# Patient Record
Sex: Female | Born: 1970 | Race: White | Hispanic: No | Marital: Married | Smoking: Never smoker
Health system: Southern US, Community
[De-identification: ages and names within clinical notes are randomized; demographics above are authoritative.]

## PROBLEM LIST (undated history)

## (undated) DIAGNOSIS — R112 Nausea with vomiting, unspecified: Secondary | ICD-10-CM

## (undated) DIAGNOSIS — Z9889 Other specified postprocedural states: Secondary | ICD-10-CM

## (undated) DIAGNOSIS — K219 Gastro-esophageal reflux disease without esophagitis: Secondary | ICD-10-CM

## (undated) DIAGNOSIS — T8859XA Other complications of anesthesia, initial encounter: Secondary | ICD-10-CM

## (undated) DIAGNOSIS — I10 Essential (primary) hypertension: Secondary | ICD-10-CM

## (undated) DIAGNOSIS — E7800 Pure hypercholesterolemia, unspecified: Secondary | ICD-10-CM

## (undated) DIAGNOSIS — H8091 Unspecified otosclerosis, right ear: Secondary | ICD-10-CM

## (undated) DIAGNOSIS — D649 Anemia, unspecified: Secondary | ICD-10-CM

## (undated) HISTORY — PX: LAPAROTOMY: SHX154

## (undated) HISTORY — PX: COLONOSCOPY, ESOPHAGOGASTRODUODENOSCOPY (EGD) AND ESOPHAGEAL DILATION: SHX5781

## (undated) HISTORY — PX: TUBAL LIGATION: SHX77

## (undated) HISTORY — PX: OTHER SURGICAL HISTORY: SHX169

## (undated) HISTORY — PX: CHOLECYSTECTOMY: SHX55

## (undated) HISTORY — PX: ABDOMINAL HYSTERECTOMY: SHX81

---
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
## 2015-09-25 DIAGNOSIS — D649 Anemia, unspecified: Secondary | ICD-10-CM | POA: Insufficient documentation

## 2015-10-17 DIAGNOSIS — N920 Excessive and frequent menstruation with regular cycle: Secondary | ICD-10-CM | POA: Insufficient documentation

## 2015-11-09 HISTORY — PX: HYSTERECTOMY ABDOMINAL WITH SALPINGECTOMY: SHX6725

## 2016-03-10 DIAGNOSIS — R519 Headache, unspecified: Secondary | ICD-10-CM

## 2016-03-10 DIAGNOSIS — G8929 Other chronic pain: Secondary | ICD-10-CM

## 2016-03-10 HISTORY — DX: Headache, unspecified: R51.9

## 2016-03-10 HISTORY — DX: Other chronic pain: G89.29

## 2016-12-18 DIAGNOSIS — K219 Gastro-esophageal reflux disease without esophagitis: Secondary | ICD-10-CM | POA: Insufficient documentation

## 2016-12-22 DIAGNOSIS — E7800 Pure hypercholesterolemia, unspecified: Secondary | ICD-10-CM | POA: Insufficient documentation

## 2017-03-10 DIAGNOSIS — H8091 Unspecified otosclerosis, right ear: Secondary | ICD-10-CM

## 2017-03-10 HISTORY — DX: Unspecified otosclerosis, right ear: H80.91

## 2017-03-24 ENCOUNTER — Other Ambulatory Visit: Payer: Self-pay | Admitting: Obstetrics and Gynecology

## 2017-03-24 DIAGNOSIS — Z1231 Encounter for screening mammogram for malignant neoplasm of breast: Secondary | ICD-10-CM

## 2017-04-08 ENCOUNTER — Encounter: Payer: Self-pay | Admitting: Radiology

## 2017-04-08 ENCOUNTER — Ambulatory Visit
Admission: RE | Admit: 2017-04-08 | Discharge: 2017-04-08 | Disposition: A | Discharge status: Home / Self Care | Payer: BLUE CROSS/BLUE SHIELD | Source: Ambulatory Visit | Attending: Obstetrics and Gynecology | Admitting: Obstetrics and Gynecology

## 2017-04-08 DIAGNOSIS — Z1231 Encounter for screening mammogram for malignant neoplasm of breast: Secondary | ICD-10-CM | POA: Diagnosis not present

## 2017-04-09 ENCOUNTER — Other Ambulatory Visit: Payer: Self-pay

## 2017-04-09 ENCOUNTER — Ambulatory Visit: Payer: BLUE CROSS/BLUE SHIELD | Attending: Obstetrics and Gynecology

## 2017-04-09 DIAGNOSIS — M62838 Other muscle spasm: Secondary | ICD-10-CM | POA: Diagnosis present

## 2017-04-09 DIAGNOSIS — R278 Other lack of coordination: Secondary | ICD-10-CM | POA: Diagnosis present

## 2017-04-09 DIAGNOSIS — M791 Myalgia, unspecified site: Secondary | ICD-10-CM

## 2017-04-09 DIAGNOSIS — M629 Disorder of muscle, unspecified: Secondary | ICD-10-CM | POA: Insufficient documentation

## 2017-04-09 DIAGNOSIS — M6289 Other specified disorders of muscle: Secondary | ICD-10-CM

## 2017-04-09 NOTE — Patient Instructions (Signed)
Stabilization: Diaphragmatic Breathing    Lie with knees bent, feet flat. Place one hand on stomach, other on chest. Breathe deeply through nose, lifting belly hand without any motion of hand on chest. Repeat __20__ times per set. Do __2__ sets per session. Do __7__ sessions per week.     Sit in knee-chest position and reach arms forward. Separate knees for comfort. Hold position for _10__ breaths. Repeat _2__ times. Do _1__ times per day.    * You can gently cup the pinky side of your hands just above the scar and pull up toward your head to mobilize the scar and fascial restrictions.

## 2017-04-09 NOTE — Therapy (Addendum)
[facility] MAIN [facility] SERVICES [address] [place], [place], [address] Phone: [phone]   Fax:  [phone]  Physical Therapy Evaluation  Patient Details  Name: Andrea Baird MRN: [id number] Date of Birth: [birth year] Referring Provider: Heloise Ochoa   Encounter Date: 04/09/2017  PT End of Session - 02/48/19 1626    Visit Number  1    Number of Visits  12    Date for PT Re-Evaluation  07/01/17    PT Start Time  1530    PT Stop Time  1630    PT Time Calculation (min)  60 min    Activity Tolerance  Patient tolerated treatment well    Behavior During Therapy  [facility] for tasks assessed/performed       History reviewed. No pertinent past medical history.  Past Surgical History:  Procedure Laterality Date  . HYSTERECTOMY ABDOMINAL WITH SALPINGECTOMY Bilateral 11/2015  . LAPAROSCOPIC ABDOMINAL EXPLORATION Bilateral 1990    There were no vitals filed for this visit.     Pelvic Floor Physical Therapy Evaluation and Assessment  SCREENING  Falls in last 6 mo: no     Red Flags:  Have you had any night sweats? Yes, hormonal Unexplained weight loss? no Saddle anesthesia? no Unexplained changes in bowel or bladder habits? no  SUBJECTIVE  Patient reports: Issue started in 1996 but has become worse over last 48 years. Pain is in the perineum, primarily the posterior fourchette.  Social/Family/Vocational History:   Working full time from home on computer.  Recent Procedures/Tests/Findings:  Vaginal ultrasound, found cyst, on birth control to shrink them. Has pain over L ovary with palpation, R is better but still slightly tender.  Obstetrical History: 3 vaginal deliveries, tearing and episiotomy,    Gynecological History: Ovarian cyst, prior fibroids, denies STI's  Urinary History: Leakage with coughing, sneezing, etc. Not wearing any panty liner. Urinary frequency/feeling of urge with none to little urine.    Gastrointestinal History: Daily BM's, bristol stool scale 4  Sexual activity/pain: Has nearly stopped intercourse due to pain.   Location of pain: posterior fourchette Current pain:  4/10  Max pain: 9/10 Least pain:  0/10 Nature of pain: ripping, tearing  Patient Goals: No pain with intercourse. Decrease leakage with coughing and sneezing.   OBJECTIVE  Posture/Observations:  Sitting: shifting, discomfort in sitting on firm surface. Standing: R shoulder low, L hip high  Palpation/Segmental Motion/Joint Play: TTP through L oblique and piriformis.  Special tests:     Range of Motion/Flexibilty:  Spine: decreased rotation to the R, slightly hiher on R spine with forward bend  Hips:   Strength/MMT: Deferred to next visit. LE MMT  LE MMT Left Right  Hip flex:  (L2) /5 /5  Hip ext: /5 /5  Hip abd: /5 /5  Hip add: /5 /5  Hip IR /5 /5  Hip ER /5 /5     Abdominal:  Palpation: TTP over B ovaries d/t cysts. TTP through psoas B, R>L Diastasis: Not assessed  Pelvic Floor External Exam: Introitus Appears: normal Skin integrity: normal Palpation: TTP through STP B Cough: paradoxical Prolapse visible?: no Scar mobility: decreased mobility and exquisitely tender  Internal Vaginal Exam: Strength (PERF): 4/5, 5 seconds Symmetry: greater tightness and tenderness on L  Palpation: TTP throughout all except for IC on R and through all muscles on L. Pain referred to the posterior fourchette with palpation of all muscles L>R. Prolapse: yes, anterior wall visible above the level of the introitus.  Gait Analysis: Deferred to next visit   Pelvic Floor Outcome Measures: Female NIH-CPSI: 28/43, VQ: 11/33  Interventions this session: NM Re-ed: Educated patient on diaphragmatic breathing in hook-lying and child's pose to decrease PFM tension and overall neuro sensitivity. Educated on need to lengthen before we strengthen and waiting for kegel's. Manual: educated patient  on TP release mechanism of action, performed TP release at posterior fourchette to decrease sensitivity. Educated on MFR to lower abdomen for improved fascial and scar mobility.  Total time: 60 min.               Objective measurements completed on examination: See above findings.              PT Education - 04/13/17 1625    Education provided  Yes    Education Details  see Pt. instructions and interventions this session     Person(s) Educated  Patient    Methods  Explanation;Demonstration;Tactile cues;Verbal cues;Handout    Comprehension  Verbalized understanding;Returned demonstration;Verbal cues required       PT Short Term Goals - 04/13/17 1620      PT SHORT TERM GOAL #1   Title  Patient will demonstrate a coordinated contraction, relaxation, and bulge of the pelvic floor muscles to demonstrate functional recruitment and motion and allow for further strengthening.    Time  6    Period  Weeks    Status  New    Target Date  05/20/17      PT SHORT TERM GOAL #2   Title  Patient will demonstrate HEP x1 in the clinic to demonstrate understanding and proper form to allow for further improvement.    Time  6    Period  Weeks    Status  New    Target Date  05/20/17      PT SHORT TERM GOAL #3   Title  Patient will demonstrate improved pelvic allignment in standing to allow for improved muscular balance and to allow relaxation of the PFM for decreased pain with intercourse.    Time  6    Period  Weeks    Status  New    Target Date  05/20/17        PT Long Term Goals - 04/13/17 1616      PT LONG TERM GOAL #1   Title  Patient will score less than or equal to 20% on the Female NIH-CPSI  and 15% on the VQ to demonstrate a reduction in pain, urinary symptoms, and an improved quality of life.    Baseline  Female NIH-CPSI: 28/43, VQ: 11/33    Time  12    Period  Weeks    Status  New    Target Date  07/01/17      PT LONG TERM GOAL #2   Title  Patient will  report no pain with intercourse to demonstrate improved functional ability.    Time  12    Period  Weeks    Status  New    Target Date  07/01/17      PT LONG TERM GOAL #3   Title  Patient will report no episodes of SUI over the course of the prior two weeks to demonstrate improved functional ability.    Time  12    Period  Weeks    Status  New    Target Date  07/01/17             Plan - 04/13/17 1627  Clinical Impression Statement  Patient is a 47 y/o female who presents today with cheif c/o pelvic pain with intercourse and at rest as well as mild SUI. History is significant for multiple vaginal deliveries with tearing and/or epesiotomies, ovarian cysts, fibroids, and hysterectomy. Clinical exam findigs include pelvic floor muscle spasms and poor coordination, myofascial restriction, poor posture, and pelvic mal-alignment. She will benefit from skilled pelvic PT to address the noted deficits and continue to assess the strength and ROM of her hips as well as a gait assessment to determine what these may be contributing to the overall problem as well.     Clinical Presentation  Evolving    Clinical Presentation due to:  ovarian cysts being treated with hormones to se if they will shrink    Clinical Decision Making  Moderate    Rehab Potential  Good    Clinical Impairments Affecting Rehab Potential  ovarian cysts, largely untreated chronic pain since 1996    PT Frequency  1x / week    PT Duration  12 weeks    PT Treatment/Interventions  ADLs/Self Care Home Management;Biofeedback;Aquatic Therapy;Electrical Stimulation;Traction;Moist Heat;Functional mobility training;Neuromuscular re-education;Therapeutic exercise;Therapeutic activities;Patient/family education;Manual techniques;Dry needling;Scar mobilization;Passive range of motion;Taping    PT Next Visit Plan  assess hip ROM/strength and gait, realign pelvis    PT Home Exercise Plan  child's pose and diaphragmatic breathing, MFR to  lower abdomen    Consulted and Agree with Plan of Care  Patient       Patient will benefit from skilled therapeutic intervention in order to improve the following deficits and impairments:  Increased fascial restricitons, Improper body mechanics, Pain, Decreased coordination, Decreased scar mobility, Increased muscle spasms, Impaired tone, Postural dysfunction, Decreased activity tolerance, Decreased range of motion, Decreased strength  Visit Diagnosis: Myalgia  Other muscle spasm  Other lack of coordination  Muscular imbalance     Problem List There are no active problems to display for this patient.  Cleophus MoltKeeli T. Vishal Sandlin DPT, ATC Cleophus MoltKeeli T Breindel Collier 04/13/2017, 4:40 PM  [facility] MAIN [facility] [address] [place], [place], [address] Phone: [phone]   Fax:  [phone]  Name: Harle StanfordSusan Fugett MRN: [id number] Date of Birth: [birth year]

## 2017-04-10 ENCOUNTER — Other Ambulatory Visit: Payer: Self-pay | Admitting: Family Medicine

## 2017-04-10 DIAGNOSIS — R1310 Dysphagia, unspecified: Secondary | ICD-10-CM

## 2017-04-13 NOTE — Addendum Note (Signed)
Addended by: Flora LippsGAILES, Duane Earnshaw T on: 04/13/2017 04:44 PM   Modules accepted: Orders

## 2017-04-14 ENCOUNTER — Other Ambulatory Visit: Payer: Self-pay | Admitting: *Deleted

## 2017-04-14 ENCOUNTER — Inpatient Hospital Stay
Admission: RE | Admit: 2017-04-14 | Discharge: 2017-04-14 | Disposition: A | Discharge status: Home / Self Care | Payer: Self-pay | Source: Ambulatory Visit | Attending: *Deleted | Admitting: *Deleted

## 2017-04-14 DIAGNOSIS — Z9289 Personal history of other medical treatment: Secondary | ICD-10-CM

## 2017-04-15 ENCOUNTER — Ambulatory Visit
Admission: RE | Admit: 2017-04-15 | Discharge: 2017-04-15 | Disposition: A | Discharge status: Home / Self Care | Payer: BLUE CROSS/BLUE SHIELD | Source: Ambulatory Visit | Attending: Family Medicine | Admitting: Family Medicine

## 2017-04-15 DIAGNOSIS — R1310 Dysphagia, unspecified: Secondary | ICD-10-CM | POA: Insufficient documentation

## 2017-04-16 ENCOUNTER — Ambulatory Visit: Payer: BLUE CROSS/BLUE SHIELD

## 2017-04-23 ENCOUNTER — Ambulatory Visit: Payer: BLUE CROSS/BLUE SHIELD | Attending: Obstetrics and Gynecology

## 2017-04-30 ENCOUNTER — Ambulatory Visit: Payer: BLUE CROSS/BLUE SHIELD

## 2017-05-04 ENCOUNTER — Encounter: Payer: Self-pay | Admitting: *Deleted

## 2017-05-05 ENCOUNTER — Encounter: Payer: Self-pay | Admitting: *Deleted

## 2017-05-05 ENCOUNTER — Ambulatory Visit: Payer: BLUE CROSS/BLUE SHIELD | Admitting: Anesthesiology

## 2017-05-05 ENCOUNTER — Ambulatory Visit
Admission: RE | Admit: 2017-05-05 | Discharge: 2017-05-05 | Disposition: A | Discharge status: Home / Self Care | Payer: BLUE CROSS/BLUE SHIELD | Source: Ambulatory Visit | Attending: Internal Medicine | Admitting: Internal Medicine

## 2017-05-05 ENCOUNTER — Encounter
Admission: RE | Disposition: A | Discharge status: Home / Self Care | Payer: Self-pay | Source: Ambulatory Visit | Attending: Internal Medicine

## 2017-05-05 DIAGNOSIS — K222 Esophageal obstruction: Secondary | ICD-10-CM | POA: Diagnosis not present

## 2017-05-05 DIAGNOSIS — Z793 Long term (current) use of hormonal contraceptives: Secondary | ICD-10-CM | POA: Diagnosis not present

## 2017-05-05 DIAGNOSIS — R1310 Dysphagia, unspecified: Secondary | ICD-10-CM | POA: Diagnosis present

## 2017-05-05 DIAGNOSIS — K219 Gastro-esophageal reflux disease without esophagitis: Secondary | ICD-10-CM | POA: Insufficient documentation

## 2017-05-05 HISTORY — DX: Anemia, unspecified: D64.9

## 2017-05-05 HISTORY — PX: ESOPHAGOGASTRODUODENOSCOPY (EGD) WITH PROPOFOL: SHX5813

## 2017-05-05 HISTORY — DX: Pure hypercholesterolemia, unspecified: E78.00

## 2017-05-05 HISTORY — DX: Gastro-esophageal reflux disease without esophagitis: K21.9

## 2017-05-05 SURGERY — ESOPHAGOGASTRODUODENOSCOPY (EGD) WITH PROPOFOL
Anesthesia: General

## 2017-05-05 MED ORDER — PROPOFOL 10 MG/ML IV BOLUS
INTRAVENOUS | Status: DC | PRN
  Administered 2017-05-05: 30 mg via INTRAVENOUS
  Administered 2017-05-05: 50 mg via INTRAVENOUS
  Administered 2017-05-05: 10 mg via INTRAVENOUS

## 2017-05-05 MED ORDER — FENTANYL CITRATE (PF) 100 MCG/2ML IJ SOLN
INTRAMUSCULAR | Status: AC
  Filled 2017-05-05: qty 2

## 2017-05-05 MED ORDER — PROPOFOL 500 MG/50ML IV EMUL
INTRAVENOUS | Status: AC
  Filled 2017-05-05: qty 50

## 2017-05-05 MED ORDER — LIDOCAINE HCL (CARDIAC) 20 MG/ML IV SOLN
INTRAVENOUS | Status: DC | PRN
Start: 2017-05-05 — End: 2017-05-05
  Administered 2017-05-05: 30 mg via INTRAVENOUS

## 2017-05-05 MED ORDER — SODIUM CHLORIDE 0.9 % IV SOLN
INTRAVENOUS | Status: DC
  Administered 2017-05-05: 1000 mL via INTRAVENOUS
  Administered 2017-05-05: 15:00:00 via INTRAVENOUS

## 2017-05-05 MED ORDER — MIDAZOLAM HCL 2 MG/2ML IJ SOLN
INTRAMUSCULAR | Status: AC
  Filled 2017-05-05: qty 2

## 2017-05-05 MED ORDER — FENTANYL CITRATE (PF) 100 MCG/2ML IJ SOLN
INTRAMUSCULAR | Status: DC | PRN
  Administered 2017-05-05: 50 ug via INTRAVENOUS

## 2017-05-05 MED ORDER — LIDOCAINE HCL (PF) 1 % IJ SOLN
INTRAMUSCULAR | Status: AC
  Administered 2017-05-05: 0.3 mL via INTRADERMAL
  Filled 2017-05-05: qty 2

## 2017-05-05 MED ORDER — PROPOFOL 500 MG/50ML IV EMUL
INTRAVENOUS | Status: DC | PRN
  Administered 2017-05-05: 250 ug/kg/min via INTRAVENOUS

## 2017-05-05 MED ORDER — LIDOCAINE HCL (PF) 1 % IJ SOLN
2.0000 mL | Freq: Once | INTRAMUSCULAR | Status: AC
  Administered 2017-05-05: 0.3 mL via INTRADERMAL

## 2017-05-05 MED ORDER — MIDAZOLAM HCL 2 MG/2ML IJ SOLN
INTRAMUSCULAR | Status: DC | PRN
  Administered 2017-05-05: 2 mg via INTRAVENOUS

## 2017-05-05 NOTE — Anesthesia Procedure Notes (Signed)
Date/Time: 05/05/2017 2:51 PM Performed by: Ginger CarneMichelet, Gera Inboden, CRNA Pre-anesthesia Checklist: Patient identified, Emergency Drugs available, Suction available, Patient being monitored and Timeout performed Patient Re-evaluated:Patient Re-evaluated prior to induction Oxygen Delivery Method: Nasal cannula Preoxygenation: Pre-oxygenation with 100% oxygen

## 2017-05-05 NOTE — Anesthesia Preprocedure Evaluation (Signed)
Anesthesia Evaluation  Patient identified by MRN, date of birth, ID band Patient awake    Reviewed: Allergy & Precautions, NPO status , Patient's Chart, lab work & pertinent test results  History of Anesthesia Complications Negative for: history of anesthetic complications  Airway Mallampati: I  TM Distance: >3 FB Neck ROM: Full    Dental no notable dental hx.    Pulmonary neg pulmonary ROS, neg sleep apnea, neg COPD,    breath sounds clear to auscultation- rhonchi (-) wheezing      Cardiovascular Exercise Tolerance: Good (-) hypertension(-) CAD, (-) Past MI, (-) Cardiac Stents and (-) CABG  Rhythm:Regular Rate:Normal - Systolic murmurs and - Diastolic murmurs    Neuro/Psych negative neurological ROS  negative psych ROS   GI/Hepatic Neg liver ROS, GERD  ,  Endo/Other  negative endocrine ROSneg diabetes  Renal/GU negative Renal ROS     Musculoskeletal negative musculoskeletal ROS (+)   Abdominal (+) + obese,   Peds  Hematology  (+) anemia ,   Anesthesia Other Findings Past Medical History: No date: Anemia No date: Elevated cholesterol No date: GERD (gastroesophageal reflux disease)   Reproductive/Obstetrics                             Anesthesia Physical Anesthesia Plan  ASA: II  Anesthesia Plan: General   Post-op Pain Management:    Induction: Intravenous  PONV Risk Score and Plan: 2 and Propofol infusion  Airway Management Planned: Natural Airway  Additional Equipment:   Intra-op Plan:   Post-operative Plan:   Informed Consent: I have reviewed the patients History and Physical, chart, labs and discussed the procedure including the risks, benefits and alternatives for the proposed anesthesia with the patient or authorized representative who has indicated his/her understanding and acceptance.   Dental advisory given  Plan Discussed with: CRNA and  Anesthesiologist  Anesthesia Plan Comments:         Anesthesia Quick Evaluation

## 2017-05-05 NOTE — H&P (Signed)
Outpatient short stay form Pre-procedure 05/05/2017 9:32 AM Jc Veron K. Norma Fredricksonoledo, M.D.  Primary Physician: Iantha FallenKahnka Linthavong, M.D.  Reason for visit:  Dysphagia, GERD.  History of present illness:  Patient is a 47 y/o female with a hx of GERD c/o dysphagia. Esophageal dysphagia which reportedly responded to esophageal dilation in 2017.   No current facility-administered medications for this encounter.   Current Outpatient Medications:  .  ibuprofen (ADVIL,MOTRIN) 100 MG tablet, Take 200 mg by mouth every 6 (six) hours as needed for fever., Disp: , Rfl:  .  norgestimate-ethinyl estradiol (ORTHO-CYCLEN,SPRINTEC,PREVIFEM) 0.25-35 MG-MCG tablet, Take 1 tablet by mouth daily., Disp: , Rfl:  .  pantoprazole (PROTONIX) 20 MG tablet, Take 20 mg by mouth daily., Disp: , Rfl:   No medications prior to admission.     No Known Allergies   Past Medical History:  Diagnosis Date  . Anemia   . Elevated cholesterol   . GERD (gastroesophageal reflux disease)     Review of systems:      Physical Exam  General appearance: alert, cooperative and appears stated age Resp: clear to auscultation bilaterally and normal percussion bilaterally Cardio: regular rate and rhythm, S1, S2 normal, no murmur, click, rub or gallop GI: soft, non-tender; bowel sounds normal; no masses,  no organomegaly     Planned procedures: EGD w/ possible biopsy and/or esophageal dilation. The patient understands the nature of the planned procedure, indications, risks, alternatives and potential complications including but not limited to bleeding, infection, perforation, damage to internal organs and possible oversedation/side effects from anesthesia. The patient agrees and gives consent to proceed.  Please refer to procedure notes for findings, recommendations and patient disposition/instructions.    Teddy Rebstock K. Norma Fredricksonoledo, M.D. Gastroenterology 05/05/2017  9:32 AM

## 2017-05-05 NOTE — Transfer of Care (Signed)
Immediate Anesthesia Transfer of Care Note  Patient: Andrea Baird  Procedure(s) Performed: ESOPHAGOGASTRODUODENOSCOPY (EGD) WITH PROPOFOL (N/A )  Patient Location: PACU  Anesthesia Type:General  Level of Consciousness: awake  Airway & Oxygen Therapy: Patient Spontanous Breathing and Patient connected to nasal cannula oxygen  Post-op Assessment: Report given to RN and Post -op Vital signs reviewed and stable  Post vital signs: Reviewed and stable  Last Vitals:  Vitals:   05/05/17 1402 05/05/17 1519  BP: 130/82 117/76  Pulse: 72 79  Resp: 17 13  Temp: 37.2 C 36.4 C  SpO2: 100% 100%    Last Pain:  Vitals:   05/05/17 1519  TempSrc: Tympanic         Complications: No apparent anesthesia complications

## 2017-05-05 NOTE — Op Note (Signed)
[facility] Gastroenterology Patient Name: Andrea Baird Procedure Date: 05/05/2017 2:49 PM MRN: [id number] Account #: [id number] Date of Birth: 04-19-1970 Admit Type: Outpatient Age: 47 Room: [facility] ENDO ROOM 2 Gender: Female Note Status: Finalized Procedure:            Upper GI endoscopy Indications:          Esophageal dysphagia Providers:            Boykin Nearing. Norma Fredrickson MD, MD Referring MD:         Marisue Ivan (Referring MD) Medicines:            Propofol per Anesthesia Complications:        No immediate complications. Procedure:            Pre-Anesthesia Assessment:                       - The risks and benefits of the procedure and the                        sedation options and risks were discussed with the                        patient. All questions were answered and informed                        consent was obtained.                       - Patient identification and proposed procedure were                        verified prior to the procedure by the nurse. The                        procedure was verified in the procedure room.                       - ASA Grade Assessment: II - A patient with mild                        systemic disease.                       - After reviewing the risks and benefits, the patient                        was deemed in satisfactory condition to undergo the                        procedure.                       After obtaining informed consent, the endoscope was                        passed under direct vision. Throughout the procedure,                        the patient's blood pressure, pulse, and oxygen  saturations were monitored continuously. The Endoscope                        was introduced through the mouth, and advanced to the                        third part of duodenum. The upper GI endoscopy was                        accomplished without difficulty. The patient tolerated                  the procedure well. Findings:      Diffuse mild mucosal changes characterized by feline appearance (subtle)       were found in the middle third of the esophagus and in the lower third       of the esophagus. Biopsies were obtained from the proximal and distal       esophagus with cold forceps for histology of suspected eosinophilic       esophagitis.      White nummular lesions were noted in the mid esophagus and in the distal       esophagus.      The entire examined stomach was normal.      The examined duodenum was normal.      One moderate benign-appearing, intrinsic stenosis was found. This       measured less than one cm (in length) and was traversed. A guidewire was       placed and the scope was withdrawn. Dilation was performed with a Savary       dilator with mild resistance at 51 Fr. Estimated blood loss was minimal.      The exam was otherwise without abnormality. Impression:           - Feline appearance (subtle) mucosa in the esophagus.                        Biopsied.                       - White nummular lesions in esophageal mucosa.                       - Normal stomach.                       - Normal examined duodenum.                       - Benign-appearing esophageal stenosis. Dilated.                       - The examination was otherwise normal. Recommendation:       - Patient has a contact number available for                        emergencies. The signs and symptoms of potential                        delayed complications were discussed with the patient.                        Return to normal activities tomorrow. Written  discharge                        instructions were provided to the patient.                       - Resume previous diet.                       - Continue present medications.                       - Await pathology results.                       - Return to my office in 6 weeks.                       - The findings and  recommendations were discussed with                        the patient and their spouse. Procedure Code(s):    --- Professional ---                       [phone], Esophagogastroduodenoscopy, flexible, transoral;                        with insertion of guide wire followed by passage of                        dilator(s) through esophagus over guide wire                       [id number], Esophagogastroduodenoscopy, flexible, transoral;                        with biopsy, single or multiple Diagnosis Code(s):    --- Professional ---                       R13.14, Dysphagia, pharyngoesophageal phase                       K22.2, Esophageal obstruction                       K22.8, Other specified diseases of esophagus CPT copyright 2016 American Medical Association. All rights reserved. The codes documented in this report are preliminary and upon coder review may  be revised to meet current compliance requirements. Stanton Kidney MD, MD 05/05/2017 3:13:43 PM This report has been signed electronically. Number of Addenda: 0 Note Initiated On: 05/05/2017 2:49 PM      [facility]

## 2017-05-05 NOTE — Anesthesia Post-op Follow-up Note (Signed)
Anesthesia QCDR form completed.        

## 2017-05-05 NOTE — Interval H&P Note (Signed)
History and Physical Interval Note:  05/05/2017 2:43 PM  Andrea HaffSusan J Coppock  has presented today for surgery, with the diagnosis of GERD DYSPHAGIA  The various methods of treatment have been discussed with the patient and family. After consideration of risks, benefits and other options for treatment, the patient has consented to  Procedure(s): ESOPHAGOGASTRODUODENOSCOPY (EGD) WITH PROPOFOL (N/A) as a surgical intervention .  The patient's history has been reviewed, patient examined, no change in status, stable for surgery.  I have reviewed the patient's chart and labs.  Questions were answered to the patient's satisfaction.     Irwintonoledo, Fing[place]odoro

## 2017-05-06 ENCOUNTER — Encounter: Payer: Self-pay | Admitting: Internal Medicine

## 2017-05-06 NOTE — Anesthesia Postprocedure Evaluation (Signed)
Anesthesia Post Note  Patient: Andrea Baird  Procedure(s) Performed: ESOPHAGOGASTRODUODENOSCOPY (EGD) WITH PROPOFOL (N/A )  Patient location during evaluation: Endoscopy Anesthesia Type: General Level of consciousness: awake and alert and oriented Pain management: pain level controlled Vital Signs Assessment: post-procedure vital signs reviewed and stable Respiratory status: spontaneous breathing, nonlabored ventilation and respiratory function stable Cardiovascular status: blood pressure returned to baseline and stable Postop Assessment: no signs of nausea or vomiting Anesthetic complications: no     Last Vitals:  Vitals:   05/05/17 1539 05/05/17 1549  BP: 112/61 115/61  Pulse: (!) 59 68  Resp: 10 12  Temp:    SpO2: 100% 100%    Last Pain:  Vitals:   05/05/17 1519  TempSrc: Tympanic                 Alexandr Oehler

## 2017-05-08 LAB — SURGICAL PATHOLOGY

## 2017-05-14 ENCOUNTER — Ambulatory Visit: Payer: BLUE CROSS/BLUE SHIELD

## 2017-06-03 NOTE — H&P (Signed)
Patient ID: Harle StanfordSusan Cronkright is a 47 y.o. female presenting with Pre Op Consulting  on 05/26/2017  HPI: F/u for multiple ovarian cysts with pelvic pain L>R, one solid and one with 0.18cm septation in 03/2017. Persistent solid cysts in the left ovary, and she does have tenderness in this area.   Hx of TLH for fibroids that turned into a TAH because of visualization, and not due to adhesions.  U/s shows: hyst  Rt ov wnl  Lt ov 1 paraovarian simple cyst=0.74 cm 2 simple cyst=1.01 cm 3 complex cyst=1.23 cm  4 solid=0.40 x 0.60 x 0.61 cm 5 solid =0.60 x 0.44 x 0.68 cm  No free fluid seen    03/2017: Pelvic US:  hyst  ROV complex cyst with septations=3cm; septation=0.18cm  LOV contains 6 cysts: 1)simple paraovarian cyst=1.7cm 2)simple=1.9cm 3)complex=1.7cm 4)complex=2.3cm 5)solid=0.98cm 6)solid=1.2cm  Left ovary,  with doppler blood flow noted around both solid cysts.   Past Medical History:  has a past medical history of Anemia, unspecified (04/2015), Encounter for blood transfusion (04/2015), GERD (gastroesophageal reflux disease), and Pure hypercholesterolemia (LDL 157 - 12/22/16) (12/22/2016).  Past Surgical History:  has a past surgical history that includes Exploratory laparotomy; egd with dilitation; hysterectomy total abdominal w/removal tubes &/or ovaries (N/A, 12/05/2015); laparoscopy diagnostic (N/A, 12/05/2015); and Hysterectomy (11/2015). Family History: family history includes Alcohol abuse in her mother; Colon polyps in her father; High blood pressure (Hypertension) in her mother; Hip fracture in her mother; Hyperlipidemia (Elevated cholesterol) in her mother; No Known Problems in her sister; Osteoarthritis in her mother; Skin cancer in her father and mother; Thyroid disease in her mother. Social History:  reports that she has never smoked. She has never used smokeless tobacco. She reports that she does not drink alcohol or use drugs. OB/GYN  History:  OB History    Gravida  4   Para  3   Term  3   Preterm      AB  1   Living  3     SAB  1   TAB      Ectopic      Molar      Multiple      Live Births          Obstetric Comments  1996, 281997, 1998 all SVD        Allergies: has No Known Allergies. Medications:  Current Outpatient Medications:  .  dexlansoprazole (DEXILANT) 30 mg DR capsule, Take 1 capsule (30 mg total) by mouth once daily Take 30 min before meals., Disp: 90 capsule, Rfl: 1 .  famotidine (PEPCID) 20 MG tablet, Take 20 mg by mouth once daily as needed for Heartburn, Disp: , Rfl:  .  norgestimate-ethinyl estradiol (ORTHO-CYCLEN,SPINTEC,PREVIFEM) 0.25-35 mg-mcg tablet, Take 1 tablet by mouth once daily, Disp: 3 Package, Rfl: 4   Review of Systems: No SOB, no palpitations or chest pain, no new lower extremity edema, no nausea or vomiting or bowel or bladder complaints. See HPI for gyn specific ROS.   Exam:   BP 118/89   Pulse 85   Ht 177.8 cm (5\' 10" )   Wt (!) 109.3 kg (241 lb)   LMP 10/13/2015   BMI 34.58 kg/m   General: Patient is well-groomed, well-nourished, appears stated age in no acute distress  HEENT: head is atraumatic and normocephalic, trachea is midline, neck is supple with no palpable nodules  CV: Regular rhythm and normal heart rate, no murmur  Pulm: Clear to auscultation throughout lung fields with no  wheezing, crackles, or rhonchi. No increased work of breathing  Abdomen: soft , no mass, non-tender, no rebound tenderness, no hepatomegaly  Pelvic: deferred  Impression:   The encounter diagnosis was Complex ovarian cyst.    Plan:    Patient returns for a preoperative discussion regarding her plans to proceed with surgical treatment of her complex Left ovarian cyst and left sided pelvic pain by dx lap with left oophorectomy and possible lysis of adhesions procedure.   The patient and I discussed the technical aspects of the  procedure including the potential for risks and complications. These include but are not limited to the risk of infection requiring post-operative antibiotics or further procedures. We talked about the risk of injury to adjacent organs including bladder, bowel, ureter, blood vessels or nerves. We talked about the need to convert to an open incision. We talked about the possible need for blood transfusion. We talked aboutpostop complications such asthromboembolic or cardiopulmonary complications. All of her questions were answered.  Her preoperative exam was completed and the appropriate consents were signed. She is scheduled to undergo this procedure in the near future.  Specific Peri-operative Considerations:  - Consent: obtained today - Labs: CBC, CMP preoperatively - Studies: EKG, CXR preoperatively - Bowel Preparation: None required - Abx:  None indicated - VTE ppx: SCDs perioperatively - Glucose Protocol: n/a - Beta-blockade: n/a

## 2017-06-05 ENCOUNTER — Other Ambulatory Visit: Payer: Self-pay

## 2017-06-05 ENCOUNTER — Encounter
Admission: RE | Admit: 2017-06-05 | Discharge: 2017-06-05 | Disposition: A | Discharge status: Home / Self Care | Payer: BLUE CROSS/BLUE SHIELD | Source: Ambulatory Visit | Attending: Obstetrics and Gynecology | Admitting: Obstetrics and Gynecology

## 2017-06-05 NOTE — Patient Instructions (Signed)
Your procedure is scheduled on: 06/12/17 Fri Report to Same Day Surgery 2nd floor medical mall [facility](Medical Mall Entrance-take elevator on left to 2nd floor.  Check in with surgery information desk.) To find out your arrival time please call [phone] between 1PM - 3PM on 06/11/17 Thurs  Remember: Instructions that are not followed completely may result in serious medical risk, up to and including death, or upon the discretion of your surgeon and anesthesiologist your surgery may need to be rescheduled.    _x___ 1. Do not eat food after midnight the night before your procedure. You may drink clear liquids up to 2 hours before you are scheduled to arrive at the hospital for your procedure.  Do not drink clear liquids within 2 hours of your scheduled arrival to the hospital.  Clear liquids include  --Water or Apple juice without pulp  --Clear carbohydrate beverage such as ClearFast or Gatorade  --Black Coffee or Clear Tea (No milk, no creamers, do not add anything to                  the coffee or Tea Type 1 and type 2 diabetics should only drink water.  No gum chewing or hard candies.     __x__ 2. No Alcohol for 24 hours before or after surgery.   __x__3. No Smoking or e-cigarettes for 24 prior to surgery.  Do not use any chewable tobacco products for at least 6 hour prior to surgery   ____  4. Bring all medications with you on the day of surgery if instructed.    __x__ 5. Notify your doctor if there is any change in your medical condition     (cold, fever, infections).    x___6. On the morning of surgery brush your teeth with toothpaste and water.  You may rinse your mouth with mouth wash if you wish.  Do not swallow any toothpaste or mouthwash.   Do not wear jewelry, make-up, hairpins, clips or nail polish.  Do not wear lotions, powders, or perfumes. You may wear deodorant.  Do not shave 48 hours prior to surgery. Men may shave face and neck.  Do not bring valuables to the hospital.     [facility] any belongings or valuables.               Contacts, dentures or bridgework may not be worn into surgery.  Leave your suitcase in the car. After surgery it may be brought to your room.  For patients admitted to the hospital, discharge time is determined by your                       treatment team.  _  Patients discharged the day of surgery will not be allowed to drive home.  You will need someone to drive you home and stay with you the night of your procedure.    Please read over the following fact sheets that you were given:   [facility] Preparing [facility] and or MRSA Information   _x___ Take anti-hypertensive listed below, cardiac, seizure, asthma,     anti-reflux and psychiatric medicines. These include:  1. Dexlansoprazole (DEXILANT) 30 MG capsule  2.  3.  4.  5.  6.  ____Fleets enema or Magnesium Citrate as directed.   _x___ Use CHG Soap or sage wipes as directed on instruction sheet   ____ Use inhalers on the day of surgery and bring to hospital  day of surgery  ____ Stop Metformin and Janumet 2 days prior to surgery.    ____ Take 1/2 of usual insulin dose the night before surgery and none on the morning     surgery.   _x___ Follow recommendations from Cardiologist, Pulmonologist or PCP regarding          stopping Aspirin, Coumadin, Plavix ,Eliquis, Effient, or Pradaxa, and Pletal.  X____Stop Anti-inflammatories such as Advil, Aleve, Ibuprofen, Motrin, Naproxen, Naprosyn, Goodies powders or aspirin products. OK to take Tylenol and                          Celebrex.   _x___ Stop supplements until after surgery.  But may continue Vitamin D, Vitamin B,       and multivitamin.   ____ Bring C-Pap to the hospital.

## 2017-06-08 ENCOUNTER — Encounter
Admission: RE | Admit: 2017-06-08 | Discharge: 2017-06-08 | Disposition: A | Discharge status: Home / Self Care | Payer: BLUE CROSS/BLUE SHIELD | Source: Ambulatory Visit | Attending: Obstetrics and Gynecology | Admitting: Obstetrics and Gynecology

## 2017-06-08 DIAGNOSIS — Z01812 Encounter for preprocedural laboratory examination: Secondary | ICD-10-CM | POA: Insufficient documentation

## 2017-06-08 LAB — CBC
HCT: 42.6 % (ref 35.0–47.0)
Hemoglobin: 13.9 g/dL (ref 12.0–16.0)
MCH: 27.8 pg (ref 26.0–34.0)
MCHC: 32.7 g/dL (ref 32.0–36.0)
MCV: 85 fL (ref 80.0–100.0)
PLATELETS: 317 10*3/uL (ref 150–440)
RBC: 5.01 MIL/uL (ref 3.80–5.20)
RDW: 15.7 % — AB (ref 11.5–14.5)
WBC: 5.9 10*3/uL (ref 3.6–11.0)

## 2017-06-08 LAB — BASIC METABOLIC PANEL
Anion gap: 9 (ref 5–15)
BUN: 9 mg/dL (ref 6–20)
CHLORIDE: 105 mmol/L (ref 101–111)
CO2: 24 mmol/L (ref 22–32)
Calcium: 9.5 mg/dL (ref 8.9–10.3)
Creatinine, Ser: 0.57 mg/dL (ref 0.44–1.00)
GFR calc Af Amer: >60 mL/min (ref >60)
GFR calc non Af Amer: >60 mL/min (ref >60)
GLUCOSE: 100 mg/dL — AB (ref 65–99)
Potassium: 3.9 mmol/L (ref 3.5–5.1)
SODIUM: 138 mmol/L (ref 135–145)

## 2017-06-08 LAB — TYPE AND SCREEN
ABO/RH(D): A POS
Antibody Screen: NEGATIVE

## 2017-06-12 ENCOUNTER — Ambulatory Visit: Payer: BLUE CROSS/BLUE SHIELD | Admitting: Anesthesiology

## 2017-06-12 ENCOUNTER — Ambulatory Visit
Admission: RE | Admit: 2017-06-12 | Discharge: 2017-06-12 | Disposition: A | Discharge status: Home / Self Care | Payer: BLUE CROSS/BLUE SHIELD | Source: Ambulatory Visit | Attending: Obstetrics and Gynecology | Admitting: Obstetrics and Gynecology

## 2017-06-12 ENCOUNTER — Encounter
Admission: RE | Disposition: A | Discharge status: Home / Self Care | Payer: Self-pay | Source: Ambulatory Visit | Attending: Obstetrics and Gynecology

## 2017-06-12 DIAGNOSIS — D649 Anemia, unspecified: Secondary | ICD-10-CM | POA: Insufficient documentation

## 2017-06-12 DIAGNOSIS — Z79899 Other long term (current) drug therapy: Secondary | ICD-10-CM | POA: Insufficient documentation

## 2017-06-12 DIAGNOSIS — E7800 Pure hypercholesterolemia, unspecified: Secondary | ICD-10-CM | POA: Insufficient documentation

## 2017-06-12 DIAGNOSIS — N736 Female pelvic peritoneal adhesions (postinfective): Secondary | ICD-10-CM | POA: Insufficient documentation

## 2017-06-12 DIAGNOSIS — R102 Pelvic and perineal pain: Secondary | ICD-10-CM | POA: Diagnosis present

## 2017-06-12 DIAGNOSIS — N83202 Unspecified ovarian cyst, left side: Secondary | ICD-10-CM | POA: Diagnosis not present

## 2017-06-12 DIAGNOSIS — K660 Peritoneal adhesions (postprocedural) (postinfection): Secondary | ICD-10-CM | POA: Insufficient documentation

## 2017-06-12 DIAGNOSIS — K219 Gastro-esophageal reflux disease without esophagitis: Secondary | ICD-10-CM | POA: Diagnosis not present

## 2017-06-12 HISTORY — PX: LYSIS OF ADHESION: SHX5961

## 2017-06-12 LAB — ABO/RH: ABO/RH(D): A POS

## 2017-06-12 LAB — POCT PREGNANCY, URINE: Preg Test, Ur: NEGATIVE

## 2017-06-12 SURGERY — OOPHORECTOMY, LAPAROSCOPIC
Anesthesia: General | Site: Abdomen | Wound class: Clean Contaminated

## 2017-06-12 MED ORDER — BUPIVACAINE HCL (PF) 0.5 % IJ SOLN
INTRAMUSCULAR | Status: AC
  Filled 2017-06-12: qty 30

## 2017-06-12 MED ORDER — FENTANYL CITRATE (PF) 100 MCG/2ML IJ SOLN
INTRAMUSCULAR | Status: DC | PRN
  Administered 2017-06-12: 100 ug via INTRAVENOUS
  Administered 2017-06-12 (×2): 50 ug via INTRAVENOUS

## 2017-06-12 MED ORDER — ROCURONIUM BROMIDE 50 MG/5ML IV SOLN
INTRAVENOUS | Status: AC
  Filled 2017-06-12: qty 1

## 2017-06-12 MED ORDER — LIDOCAINE HCL (CARDIAC) 20 MG/ML IV SOLN
INTRAVENOUS | Status: DC | PRN
  Administered 2017-06-12: 50 mg via INTRAVENOUS

## 2017-06-12 MED ORDER — HYDROMORPHONE HCL 1 MG/ML IJ SOLN
0.2500 mg | INTRAMUSCULAR | Status: DC | PRN
  Administered 2017-06-12 (×4): 0.5 mg via INTRAVENOUS

## 2017-06-12 MED ORDER — SEVOFLURANE IN SOLN
RESPIRATORY_TRACT | Status: AC
  Filled 2017-06-12: qty 250

## 2017-06-12 MED ORDER — LACTATED RINGERS IV SOLN
INTRAVENOUS | Status: DC
  Administered 2017-06-12: 17:00:00 via INTRAVENOUS

## 2017-06-12 MED ORDER — GABAPENTIN 800 MG PO TABS: 800.0000 mg | ORAL_TABLET | Freq: Every day | ORAL | 0 refills | Status: DC

## 2017-06-12 MED ORDER — CEFAZOLIN SODIUM 1 G IJ SOLR
INTRAMUSCULAR | Status: AC
  Filled 2017-06-12: qty 10

## 2017-06-12 MED ORDER — PROPOFOL 10 MG/ML IV BOLUS
INTRAVENOUS | Status: DC | PRN
  Administered 2017-06-12: 150 mg via INTRAVENOUS
  Administered 2017-06-12: 50 mg via INTRAVENOUS

## 2017-06-12 MED ORDER — KETOROLAC TROMETHAMINE 30 MG/ML IJ SOLN
30.0000 mg | Freq: Once | INTRAMUSCULAR | Status: AC | PRN
  Administered 2017-06-12: 30 mg via INTRAVENOUS

## 2017-06-12 MED ORDER — BUPIVACAINE HCL 0.5 % IJ SOLN
INTRAMUSCULAR | Status: DC | PRN
  Administered 2017-06-12: 12 mL

## 2017-06-12 MED ORDER — KETOROLAC TROMETHAMINE 30 MG/ML IJ SOLN
INTRAMUSCULAR | Status: AC
  Filled 2017-06-12: qty 1

## 2017-06-12 MED ORDER — ESMOLOL HCL 100 MG/10ML IV SOLN
INTRAVENOUS | Status: AC
  Filled 2017-06-12: qty 10

## 2017-06-12 MED ORDER — LIDOCAINE HCL (PF) 2 % IJ SOLN
INTRAMUSCULAR | Status: AC
  Filled 2017-06-12: qty 10

## 2017-06-12 MED ORDER — DEXAMETHASONE SODIUM PHOSPHATE 10 MG/ML IJ SOLN
INTRAMUSCULAR | Status: AC
  Filled 2017-06-12: qty 1

## 2017-06-12 MED ORDER — HYDROCODONE-ACETAMINOPHEN 7.5-325 MG PO TABS
ORAL_TABLET | ORAL | Status: AC
  Administered 2017-06-12: 1 via ORAL
  Filled 2017-06-12: qty 1

## 2017-06-12 MED ORDER — SUGAMMADEX SODIUM 200 MG/2ML IV SOLN
INTRAVENOUS | Status: DC | PRN
  Administered 2017-06-12: 200 mg via INTRAVENOUS

## 2017-06-12 MED ORDER — MIDAZOLAM HCL 2 MG/2ML IJ SOLN
INTRAMUSCULAR | Status: DC | PRN
  Administered 2017-06-12: 2 mg via INTRAVENOUS

## 2017-06-12 MED ORDER — ACETAMINOPHEN 10 MG/ML IV SOLN
INTRAVENOUS | Status: AC
  Filled 2017-06-12: qty 100

## 2017-06-12 MED ORDER — PROPOFOL 10 MG/ML IV BOLUS
INTRAVENOUS | Status: AC
  Filled 2017-06-12: qty 20

## 2017-06-12 MED ORDER — HYDROMORPHONE HCL 1 MG/ML IJ SOLN
INTRAMUSCULAR | Status: AC
  Filled 2017-06-12: qty 1

## 2017-06-12 MED ORDER — OXYCODONE HCL 5 MG PO CAPS: 5.0000 mg | ORAL_CAPSULE | Freq: Four times a day (QID) | ORAL | 0 refills | Status: DC | PRN

## 2017-06-12 MED ORDER — GLYCOPYRROLATE 0.2 MG/ML IJ SOLN
INTRAMUSCULAR | Status: AC
  Filled 2017-06-12: qty 1

## 2017-06-12 MED ORDER — PROMETHAZINE HCL 25 MG/ML IJ SOLN
INTRAMUSCULAR | Status: AC
  Administered 2017-06-12: 12.5 mg via INTRAVENOUS
  Filled 2017-06-12: qty 1

## 2017-06-12 MED ORDER — HYDROMORPHONE HCL 1 MG/ML IJ SOLN
INTRAMUSCULAR | Status: AC
  Administered 2017-06-12: 0.5 mg via INTRAVENOUS
  Filled 2017-06-12: qty 1

## 2017-06-12 MED ORDER — ACETAMINOPHEN 325 MG PO TABS
ORAL_TABLET | ORAL | Status: AC
  Administered 2017-06-12: 1000 mg via ORAL
  Filled 2017-06-12: qty 3

## 2017-06-12 MED ORDER — ROCURONIUM BROMIDE 100 MG/10ML IV SOLN
INTRAVENOUS | Status: DC | PRN
  Administered 2017-06-12: 10 mg via INTRAVENOUS
  Administered 2017-06-12: 50 mg via INTRAVENOUS
  Administered 2017-06-12 (×2): 10 mg via INTRAVENOUS

## 2017-06-12 MED ORDER — ESMOLOL HCL 100 MG/10ML IV SOLN
INTRAVENOUS | Status: DC | PRN
  Administered 2017-06-12 (×2): 20 mg via INTRAVENOUS
  Administered 2017-06-12 (×3): 10 mg via INTRAVENOUS

## 2017-06-12 MED ORDER — ONDANSETRON HCL 4 MG/2ML IJ SOLN
INTRAMUSCULAR | Status: DC | PRN
  Administered 2017-06-12: 4 mg via INTRAVENOUS

## 2017-06-12 MED ORDER — GABAPENTIN 300 MG PO CAPS
ORAL_CAPSULE | ORAL | Status: AC
  Filled 2017-06-12: qty 3

## 2017-06-12 MED ORDER — DEXAMETHASONE SODIUM PHOSPHATE 10 MG/ML IJ SOLN
INTRAMUSCULAR | Status: DC | PRN
  Administered 2017-06-12: 10 mg via INTRAVENOUS

## 2017-06-12 MED ORDER — LIDOCAINE HCL (PF) 1 % IJ SOLN
INTRAMUSCULAR | Status: AC
  Filled 2017-06-12: qty 2

## 2017-06-12 MED ORDER — PROMETHAZINE HCL 25 MG/ML IJ SOLN
6.2500 mg | INTRAMUSCULAR | Status: DC | PRN
  Administered 2017-06-12: 12.5 mg via INTRAVENOUS

## 2017-06-12 MED ORDER — HYDROCODONE-ACETAMINOPHEN 7.5-325 MG PO TABS
1.0000 | ORAL_TABLET | Freq: Once | ORAL | Status: AC | PRN
  Administered 2017-06-12: 1 via ORAL

## 2017-06-12 MED ORDER — ONDANSETRON HCL 4 MG/2ML IJ SOLN
INTRAMUSCULAR | Status: AC
  Filled 2017-06-12: qty 2

## 2017-06-12 MED ORDER — IBUPROFEN 800 MG PO TABS: 800.0000 mg | ORAL_TABLET | Freq: Three times a day (TID) | ORAL | 1 refills | Status: DC | PRN

## 2017-06-12 MED ORDER — LACTATED RINGERS IV SOLN
INTRAVENOUS | Status: DC
  Administered 2017-06-12 (×2): via INTRAVENOUS

## 2017-06-12 MED ORDER — SODIUM CHLORIDE FLUSH 0.9 % IV SOLN
INTRAVENOUS | Status: AC
  Administered 2017-06-12: 5 mL
  Filled 2017-06-12: qty 10

## 2017-06-12 MED ORDER — ACETAMINOPHEN 500 MG PO TABS
1000.0000 mg | ORAL_TABLET | ORAL | Status: AC
  Administered 2017-06-12: 1000 mg via ORAL

## 2017-06-12 MED ORDER — CEFAZOLIN SODIUM-DEXTROSE 2-3 GM-%(50ML) IV SOLR
INTRAVENOUS | Status: DC | PRN
  Administered 2017-06-12: 2 g via INTRAVENOUS

## 2017-06-12 MED ORDER — DOCUSATE SODIUM 100 MG PO CAPS: 100.0000 mg | ORAL_CAPSULE | Freq: Two times a day (BID) | ORAL | 0 refills | Status: DC

## 2017-06-12 MED ORDER — MIDAZOLAM HCL 2 MG/2ML IJ SOLN
INTRAMUSCULAR | Status: AC
Start: 2017-06-12 — End: ?
  Filled 2017-06-12: qty 2

## 2017-06-12 MED ORDER — HYDROMORPHONE HCL 1 MG/ML IJ SOLN
INTRAMUSCULAR | Status: DC | PRN
  Administered 2017-06-12: 0.5 mg via INTRAVENOUS
  Administered 2017-06-12: 1 mg via INTRAVENOUS
  Administered 2017-06-12: 0.5 mg via INTRAVENOUS

## 2017-06-12 MED ORDER — FENTANYL CITRATE (PF) 100 MCG/2ML IJ SOLN
INTRAMUSCULAR | Status: AC
  Filled 2017-06-12: qty 2

## 2017-06-12 MED ORDER — GLYCOPYRROLATE 0.2 MG/ML IJ SOLN
INTRAMUSCULAR | Status: DC | PRN
  Administered 2017-06-12: 0.2 mg via INTRAVENOUS

## 2017-06-12 MED ORDER — KETOROLAC TROMETHAMINE 30 MG/ML IJ SOLN
INTRAMUSCULAR | Status: AC
  Administered 2017-06-12: 30 mg via INTRAVENOUS
  Filled 2017-06-12: qty 1

## 2017-06-12 MED ORDER — ACETAMINOPHEN 500 MG PO TABS: 1000.0000 mg | ORAL_TABLET | Freq: Four times a day (QID) | ORAL | 0 refills | Status: AC

## 2017-06-12 MED ORDER — MEPERIDINE HCL 50 MG/ML IJ SOLN: 6.2500 mg | INTRAMUSCULAR | Status: DC | PRN

## 2017-06-12 MED ORDER — GABAPENTIN 300 MG PO CAPS
900.0000 mg | ORAL_CAPSULE | ORAL | Status: AC
  Administered 2017-06-12: 900 mg via ORAL

## 2017-06-12 SURGICAL SUPPLY — 46 items
BAG URINE DRAINAGE (UROLOGICAL SUPPLIES) ×4 IMPLANT
BLADE SURG SZ11 CARB STEEL (BLADE) ×4 IMPLANT
CATH FOLEY 2WAY  5CC 16FR (CATHETERS) ×2
CATH URTH 16FR FL 2W BLN LF (CATHETERS) ×2 IMPLANT
CHLORAPREP W/TINT 26ML (MISCELLANEOUS) ×4 IMPLANT
CLOSURE WOUND 1/4X4 (GAUZE/BANDAGES/DRESSINGS) ×1
CNTNR SPEC 2.5X3XGRAD LEK (MISCELLANEOUS) ×2
CONT SPEC 4OZ STER OR WHT (MISCELLANEOUS) ×2
CONTAINER SPEC 2.5X3XGRAD LEK (MISCELLANEOUS) ×2 IMPLANT
CORD MONOPOLAR M/FML 12FT (MISCELLANEOUS) ×4 IMPLANT
DERMABOND ADVANCED (GAUZE/BANDAGES/DRESSINGS) ×2
DERMABOND ADVANCED .7 DNX12 (GAUZE/BANDAGES/DRESSINGS) ×2 IMPLANT
DEVICE PMI PUNCTURE CLOSURE (MISCELLANEOUS) ×4 IMPLANT
GLOVE BIO SURGEON STRL SZ7 (GLOVE) ×8 IMPLANT
GLOVE INDICATOR 7.5 STRL GRN (GLOVE) ×4 IMPLANT
GLOVE PI ORTHOPRO 6.5 (GLOVE) ×2
GLOVE PI ORTHOPRO STRL 6.5 (GLOVE) ×2 IMPLANT
GLOVE SURG SYN 6.5 ES PF (GLOVE) ×4 IMPLANT
GOWN STRL REUS W/ TWL LRG LVL3 (GOWN DISPOSABLE) ×4 IMPLANT
GOWN STRL REUS W/TWL LRG LVL3 (GOWN DISPOSABLE) ×4
IRRIGATION STRYKERFLOW (MISCELLANEOUS) ×2 IMPLANT
IRRIGATOR STRYKERFLOW (MISCELLANEOUS) ×4
IV NS 1000ML (IV SOLUTION) ×2
IV NS 1000ML BAXH (IV SOLUTION) ×2 IMPLANT
KIT PINK PAD W/HEAD ARE REST (MISCELLANEOUS) ×4
KIT PINK PAD W/HEAD ARM REST (MISCELLANEOUS) ×2 IMPLANT
KIT TURNOVER CYSTO (KITS) ×4 IMPLANT
LABEL OR SOLS (LABEL) ×4 IMPLANT
NS IRRIG 500ML POUR BTL (IV SOLUTION) ×4 IMPLANT
PACK GYN LAPAROSCOPIC (MISCELLANEOUS) ×4 IMPLANT
PAD OB MATERNITY 4.3X12.25 (PERSONAL CARE ITEMS) ×4 IMPLANT
PAD PREP 24X41 OB/GYN DISP (PERSONAL CARE ITEMS) ×4 IMPLANT
POUCH SPECIMEN RETRIEVAL 10MM (ENDOMECHANICALS) ×4 IMPLANT
SCISSORS METZENBAUM CVD 33 (INSTRUMENTS) ×4 IMPLANT
SET IRRIG Y TYPE TUR BLADDER L (SET/KITS/TRAYS/PACK) ×4 IMPLANT
SLEEVE ENDOPATH XCEL 5M (ENDOMECHANICALS) ×8 IMPLANT
SPONGE XRAY 4X4 16PLY STRL (MISCELLANEOUS) ×4 IMPLANT
STRIP CLOSURE SKIN 1/4X4 (GAUZE/BANDAGES/DRESSINGS) ×3 IMPLANT
SUT MNCRL AB 4-0 PS2 18 (SUTURE) ×4 IMPLANT
SUT VIC AB 0 CT1 36 (SUTURE) ×4 IMPLANT
SUT VIC AB 2-0 UR6 27 (SUTURE) ×4 IMPLANT
SUT VIC AB 4-0 SH 27 (SUTURE) ×2
SUT VIC AB 4-0 SH 27XANBCTRL (SUTURE) ×2 IMPLANT
SYR 50ML LL SCALE MARK (SYRINGE) ×4 IMPLANT
TROCAR XCEL NON-BLD 5MMX100MML (ENDOMECHANICALS) ×4 IMPLANT
TUBING INSUFFLATION (TUBING) ×4 IMPLANT

## 2017-06-12 NOTE — Consult Note (Signed)
SURGICAL CONSULTATION NOTE (initial) - cpt: [phone]  HISTORY OF PRESENT ILLNESS (HPI):  47 y.o. female presented to [facility] today for elective Left oophorectomy with gynecology in the context of complex Left ovarian cyst(s) and pelvic pain s/p prior total abdominal hysterectomy for fibroids. Intra-operatively, there was found to be a single loop of small intestine adherent to the anterior abdominal wall impairing visualization and dense pelvic adhesions to the Left ovary. General surgery was consulted intraoperatively to assist with both of these findings.  Surgery is consulted intraoperatively by gynecologist Dr. Dalbert Garnet in this context for pelvic and other intraperitoneal adhesions.  PAST MEDICAL HISTORY (PMH):  Past Medical History:  Diagnosis Date  . Anemia   . Elevated cholesterol   . GERD (gastroesophageal reflux disease)      PAST SURGICAL HISTORY (PSH):  Past Surgical History:  Procedure Laterality Date  . ABDOMINAL HYSTERECTOMY    . COLONOSCOPY, ESOPHAGOGASTRODUODENOSCOPY (EGD) AND ESOPHAGEAL DILATION    . ESOPHAGOGASTRODUODENOSCOPY (EGD) WITH PROPOFOL N/A 05/05/2017   Procedure: ESOPHAGOGASTRODUODENOSCOPY (EGD) WITH PROPOFOL;  Surgeon: Toledo, Boykin Nearing, MD;  Location: [facility] ENDOSCOPY;  Service: Gastroenterology;  Laterality: N/A;  . HYSTERECTOMY ABDOMINAL WITH SALPINGECTOMY Bilateral 11/2015  . LAPAROSCOPIC ABDOMINAL EXPLORATION Bilateral 1990  . LAPAROTOMY    . laproscopy    . TUBAL LIGATION       MEDICATIONS:  Prior to Admission medications   Medication Sig Start Date End Date Taking? Authorizing Provider  Dexlansoprazole (DEXILANT) 30 MG capsule Take 30 mg by mouth daily.   Yes [provider]  doxylamine, Sleep, (UNISOM) 25 MG tablet Take 25 mg by mouth at bedtime as needed for sleep.   Yes [provider]  ibuprofen (ADVIL,MOTRIN) 200 MG tablet Take 400 mg by mouth 2 (two) times daily as needed for headache or moderate pain.   Yes [provider]  norgestimate-ethinyl estradiol (ORTHO-CYCLEN,SPRINTEC,PREVIFEM) 0.25-35 MG-MCG tablet Take 1 tablet by mouth daily.   Yes [provider]  Pseudoephedrine-APAP-DM (DAYQUIL PO) Take 1 Dose by mouth daily as needed (cold symptoms).   Yes [provider]     ALLERGIES:  No Known Allergies   SOCIAL HISTORY:  Social History   Socioeconomic History  . Marital status: Married    Spouse name: Not on file  . Number of children: Not on file  . Years of education: Not on file  . Highest education level: Not on file  Occupational History  . Not on file  Social Needs  . Financial resource strain: Not on file  . Food insecurity:    Worry: Not on file    Inability: Not on file  . Transportation needs:    Medical: Not on file    Non-medical: Not on file  Tobacco Use  . Smoking status: Never Smoker  . Smokeless tobacco: Never Used  Substance and Sexual Activity  . Alcohol use: No    Frequency: Never  . Drug use: No  . Sexual activity: Yes    Partners: Male    Comment: painful  Lifestyle  . Physical activity:    Days per week: Not on file    Minutes per session: Not on file  . Stress: Not on file  Relationships  . Social connections:    Talks on phone: Not on file    Gets together: Not on file    Attends religious service: Not on file    Active member of club or organization: Not on file    Attends meetings of clubs or  organizations: Not on file    Relationship status: Not on file  . Intimate partner violence:    Fear of current or ex partner: Not on file    Emotionally abused: Not on file    Physically abused: Not on file    Forced sexual activity: Not on file  Other Topics Concern  . Not on file  Social History Narrative  . Not on file    The patient currently resides (home / rehab facility / nursing home): Home The patient normally is (ambulatory / bedbound): Ambulatory   FAMILY HISTORY:  Family History  Problem Relation Age of Onset  . Cancer  Mother   . COPD Mother   . High blood pressure Mother   . Cancer Father   . Breast cancer Neg Hx      REVIEW OF SYSTEMS: Unable to perform due to patient intubated under general anesthesia  All other review of systems were negative   VITAL SIGNS:  Temp:  [97.9 F (36.6 C)] 97.9 F (36.6 C) (04/05 1210) Pulse Rate:  [75] 75 (04/05 1210) Resp:  [16] 16 (04/05 1210) BP: (151)/(93) 151/93 (04/05 1210) SpO2:  [100 %] 100 % (04/05 1210)             INTAKE/OUTPUT:  This shift: Total I/O In: 700 [I.V.:700] Out: 400 [Urine:400]  Last 2 shifts: @IOLAST2SHIFTS @   PHYSICAL EXAM:  Constitutional:  -- Normal body habitus  -- General anesthesia ongoing Pulmonary:  -- Intubated Cardiovascular:  -- S1, S2 present Gastrointestinal:  -- Abdomen insufflated with two 5 mm laparoscopic ports and a LLQ 10 mm laparoscopic port Musculoskeletal and Integumentary:  -- Wounds or skin discoloration: None appreciated except laparoscopic port sites as described above (GI)  Labs:  CBC Latest Ref Rng & Units 06/08/2017  WBC 3.6 - 11.0 K/uL 5.9  Hemoglobin 12.0 - 16.0 g/dL 45.413.9  Hematocrit 09.835.0 - 47.0 % 42.6  Platelets 150 - 440 K/uL 317   CMP Latest Ref Rng & Units 06/08/2017  Glucose 65 - 99 mg/dL 119(J100(H)  BUN 6 - 20 mg/dL 9  Creatinine 4.780.44 - 2.951.00 mg/dL 6.210.57  Sodium 308135 - 657145 mmol/L 138  Potassium 3.5 - 5.1 mmol/L 3.9  Chloride 101 - 111 mmol/L 105  CO2 22 - 32 mmol/L 24  Calcium 8.9 - 10.3 mg/dL 9.5   Imaging studies: No pertinent imaging studies available for review  Assessment/Plan: (ICD-10's: K66.0) 47 y.o. female with ongoing laparoscopic Left oophorectomy for complex Left ovarian cyst(s) and pelvic pain s/p total abdominal hysterectomy, complicated by dense pelvic adhesions and a single loop of small intestine adherent to the anterior abdominal wall.   - Patient already intubated under general anesthesia with laparoscopic ports placed and surgery already in progress when consultation  requested, was able to provide assistance with pelvic lysis of adhesions, but did not do anything with single loop of non-dilated small intestine adherent to anterior abdominal wall as it was not preventing safe progression of surgery and laparoscopic lysis of focal dense pelvic adhesions with which assistance was provided. Because this was essentially part of Dr. Francena HanlyBeasley's ongoing surgery already in progress, it will be described in her operative report in detail, but blunt dissection and very minimal selective harmonic scalpel for lysis of clearly visualized inflammatory adhesions was performed with clear visualization of the Left ovary and surrounding structures. There at no time appeared any injury to the nearby sigmoid colon or small bowel.  Thank you for the opportunity to participate in  this patient's care.   -- Xiamara Hulet E. Yosiel Thieme, MD, RPVI [place]: [facility] General Surgery - Partnering for exceptional care. Office: [phone] 

## 2017-06-12 NOTE — Anesthesia Post-op Follow-up Note (Signed)
Anesthesia QCDR form completed.        

## 2017-06-12 NOTE — Anesthesia Procedure Notes (Signed)
Procedure Name: Intubation Performed by: Hadlie Gipson, CRNA Pre-anesthesia Checklist: Patient identified, Patient being monitored, Timeout performed, Emergency Drugs available and Suction available Patient Re-evaluated:Patient Re-evaluated prior to induction Oxygen Delivery Method: Circle system utilized Preoxygenation: Pre-oxygenation with 100% oxygen Induction Type: IV induction Ventilation: Mask ventilation without difficulty Laryngoscope Size: Miller and 2 Grade View: Grade I Tube type: Oral Tube size: 7.0 mm Number of attempts: 1 Placement Confirmation: ETT inserted through vocal cords under direct vision,  positive ETCO2 and breath sounds checked- equal and bilateral Secured at: 21 cm Tube secured with: Tape Dental Injury: Teeth and Oropharynx as per pre-operative assessment        

## 2017-06-12 NOTE — Discharge Instructions (Signed)
AMBULATORY SURGERY  °DISCHARGE INSTRUCTIONS ° ° °1) The drugs that you were given will stay in your system until tomorrow so for the next 24 hours you should not: ° °A) Drive an automobile °B) Make any legal decisions °C) Drink any alcoholic beverage ° ° °2) You may resume regular meals tomorrow.  Today it is better to start with liquids and gradually work up to solid foods. ° °You may eat anything you prefer, but it is better to start with liquids, then soup and crackers, and gradually work up to solid foods. ° ° °3) Please notify your doctor immediately if you have any unusual bleeding, trouble breathing, redness and pain at the surgery site, drainage, fever, or pain not relieved by medication. ° ° ° °4) Additional Instructions: ° ° ° ° ° ° ° °Please contact your physician with any problems or Same Day Surgery at [phone], Monday through Friday 6 am to 4 pm, or [facility] [place] Main number at [phone].Laparoscopic Ovarian Surgery Discharge Instructions ° °For the next three days, take ibuprofen and acetaminophen on a schedule, every 8 hours. You can take them together or you can intersperse them, and take one every four hours. I also gave you gabapentin for nighttime, to help you sleep and also to control pain. Take gabapentin medicines at night for at least the next 3 nights. You also have a narcotic, oxycodone, to take as needed if the above medicines don't help. ° °Postop constipation is a major cause of pain. Stay well hydrated, walk as you tolerate, and take over the counter senna as well as stool softeners if you need them. ° ° °RISKS AND COMPLICATIONS  °· Infection. °· Bleeding. °· Injury to surrounding organs. °· Anesthetic side effects. ° ° °PROCEDURE  °· You may be given a medicine to help you relax (sedative) before the procedure. You will be given a medicine to make you sleep (general anesthetic) during the procedure. °· A tube will be put down your throat to help your breath while under  general anesthesia. °· Several small cuts (incisions) are made in the lower abdominal area and one incision is made near the belly button. °· Your abdominal area will be inflated with a safe gas (carbon dioxide). This helps give the surgeon room to operate, visualize, and helps the surgeon avoid other organs. °· A thin, lighted tube (laparoscope) with a camera attached is inserted into your abdomen through the incision near the belly button. Other small instruments may also be inserted through other abdominal incisions. °· The ovary is located and are removed. °· After the ovary is removed, the gas is released from the abdomen. °· The incisions will be closed with stitches (sutures), and Dermabond. A bandage may be placed over the incisions. ° °AFTER THE PROCEDURE  °· You will also have some mild abdominal discomfort for 3-7 days. You will be given pain medicine to ease any discomfort. °· As long as there are no problems, you may be allowed to go home. Someone will need to drive you home and be with you for at least 24 hours once home. °· You may have some mild discomfort in the throat. This is from the tube placed in your throat while you were sleeping. °· You may experience discomfort in the shoulder area from some trapped air between the liver and diaphragm. This sensation is normal and will slowly go away on its own. ° °HOME CARE INSTRUCTIONS  °· Take all medicines as directed. °·   Only take over-the-counter or prescription medicines for pain, discomfort, or fever as directed by your caregiver. °· Resume daily activities as directed. °· Showers are preferred over baths for 2 weeks. °· You may resume sexual activities in 1 week or as you feel you would like to. °· Do not drive while taking narcotics. ° °SEEK MEDICAL CARE IF: . °· There is increasing abdominal pain. °· You feel lightheaded or faint. °· You have the chills. °· You have an oral temperature above 102° F (38.9° C). °· There is pus-like (purulent)  drainage from any of the wounds. °· You are unable to pass gas or have a bowel movement. °· You feel sick to your stomach (nauseous) or throw up (vomit) and can't control it with your medicines. ° °MAKE SURE YOU:  °· Understand these instructions. °· Will watch your condition. °· Will get help right away if you are not doing well or get worse. ° °[employer]® Patient Information ©2013 [employer]. ° ° ° ° ° °

## 2017-06-12 NOTE — Op Note (Addendum)
Andrea HaffSusan J Baird PROCEDURE DATE: 06/12/2017  PREOPERATIVE DIAGNOSIS: Left sided pelvic pain, complex ovarian cyst POSTOPERATIVE DIAGNOSIS: Pelvic bowel adhesions, encased ovary, small bowel adhesions to anterior pelvic wall PROCEDURE: Diagnostic laparoscopy, lysis of adhesions, left oophorectomy, cystoscopy - lysis of adhesions performed for over 50% of the time for this case SURGEON:  Dr. Christeen DouglasBethany Bergen Magner ASSISTANT: Dr. Leeroy Bockhelsea Ward,  General surgery consult intraop: Dr. Satira MccallumJason Davis ANESTHESIOLOGIST: Naomie DeanKephart, William K, MD Anesthesiologist: Naomie DeanKephart, William K, MD CRNA: Oliva Bustardorbett, Jamie E, CRNA; Mathews ArgyleLogan, Benjamin, CRNA  INDICATIONS: 47 y.o. F with history of left pelvic pain and complex ovarian cyst desiring surgical evaluation.   Please see preoperative notes for further details.  Risks of surgery were discussed with the patient including but not limited to: bleeding which may require transfusion or reoperation; infection which may require antibiotics; injury to bowel, bladder, ureters or other surrounding organs; need for additional procedures including laparotomy; thromboembolic phenomenon, incisional problems and other postoperative/anesthesia complications. Written informed consent was obtained.    FINDINGS:  Absent uterus. Thick omental adhesions at the umbilicus and several loops of bowel separately below the prior laparotomy scar on the left. Normal upper abdomen. Extensive sigmoid adhesions to the left pelvic sidewall, with the left ovary entirely retroperitoneal. Right ovary not visualized.  Strong bilateral ureteral efflux on cystoscopy  Peritoneal washings were taken and sent to pathology.   ANESTHESIA:    General INTRAVENOUS FLUIDS: 1200 ml ESTIMATED BLOOD LOSS: minimal URINE OUTPUT: 400 ml SPECIMENS: Left ovary and tube, pelvic washings  COMPLICATIONS: None immediate  PROCEDURE IN DETAIL:  The patient had sequential compression devices applied to her lower extremities while in the  preoperative area.  She was then taken to the operating room where general anesthesia was administered and was found to be adequate.  She was placed in the dorsal supine position, and was prepped and draped in a sterile manner.  A Foley catheter was inserted into her bladder and attached to constant drainage.  After an adequate timeout was performed, attention was turned to the abdomen where an umbilical incision was made with the scalpel.  The Optiview 5-mm trocar and sleeve were then advanced without difficulty with the laparoscope under direct visualization into the abdomen.  The abdomen was then insufflated with carbon dioxide gas and adequate pneumoperitoneum was obtained.   A detailed survey of the patient's pelvis and abdomen revealed the findings as mentioned above.  An additional 5mm trocar was placed in the right lower quadrants under direct visualization, and at 10mm trocar was placed in the opposite quadrant under direct visualization.   The omental adhesions were taken down carefully using the Harmonic.  Pelvic washings were taken.   General surgery was called to consider bowel adhesion removal, and while we were able to work around them, general surgery did stay to assist in dissecting the left ovary from the left pelvic sidewall. Careful blunt dissection was used. The ovary was separated from the pelvic sidewall, the IP identified and isolated, and the IP was desiccated with the fulgurator.  It was transected to remove the ovary and what remained of the fallopian tube in its entirety, being careful not to spill the contents of the ovarian cyst. The ovary was placed in a bag and removed through the left port.  The operative site was surveyed, and it was found to be hemostatic.  No intraoperative injury to surrounding organs was noted. The fascia of the 10 mm trocar site was closed with 0 Vicryl.   Pictures were  taken of the quadrants and pelvis. The abdomen was desufflated and all  instruments were then removed from the patient's abdomen. The uterine manipulator was removed without complications.  All incisions were closed with 4-0 Vicryl and Dermabond.   CYSTOCOPY PARAGRAPH The Foley catheter was temporarily removed and an uncomplicated cystoscopy was performed. 300 mL of fluid was instilled and excellent efflux was noted from both ureteral orifices.  The patient tolerated the procedures well.  All instruments, needles, and sponge counts were correct x 2. The patient was taken to the recovery room in stable condition.

## 2017-06-12 NOTE — Interval H&P Note (Signed)
History and Physical Interval Note:  06/12/2017 1:48 PM  Andrea Baird  has presented today for surgery, with the diagnosis of complex left ovarian cyst  The various methods of treatment have been discussed with the patient and family. After consideration of risks, benefits and other options for treatment, the patient has consented to  Procedure(s): LAPAROSCOPIC OOPHORECTOMY, POSSIBLE RIGHT OOPHORECTOMY (Left) LYSIS OF ADHESION (N/A) as a surgical intervention .  The patient's history has been reviewed, patient examined, no change in status, stable for surgery.  I have reviewed the patient's chart and labs.  Questions were answered to the patient's satisfaction.     Christeen DouglasBethany Jillana Selph

## 2017-06-12 NOTE — Transfer of Care (Signed)
Immediate Anesthesia Transfer of Care Note  Patient: Andrea Baird  Procedure(s) Performed: laparoscopic lysis of adhesions, removal of left adnexa, cystoscopy (Left Abdomen) LYSIS OF ADHESION (N/A )  Patient Location: PACU  Anesthesia Type:General  Level of Consciousness: alert   Airway & Oxygen Therapy: Patient Spontanous Breathing  Post-op Assessment: Report given to RN  Post vital signs: stable  Last Vitals:  Vitals Value Taken Time  BP 126/86 06/12/2017  7:53 PM  Temp 36.2 C 06/12/2017  7:53 PM  Pulse 65 06/12/2017  7:53 PM  Resp 13 06/12/2017  7:53 PM  SpO2 98 % 06/12/2017  7:53 PM    Last Pain:  Vitals:   06/12/17 1953  TempSrc: Tympanic  PainSc:          Complications: No apparent anesthesia complications

## 2017-06-12 NOTE — Anesthesia Postprocedure Evaluation (Signed)
Anesthesia Post Note  Patient: Andrea Baird  Procedure(s) Performed: laparoscopic lysis of adhesions, removal of left adnexa, cystoscopy (Left Abdomen) LYSIS OF ADHESION (N/A )  Patient location during evaluation: PACU Anesthesia Type: General Level of consciousness: awake and alert Pain management: pain level controlled Vital Signs Assessment: post-procedure vital signs reviewed and stable Respiratory status: spontaneous breathing and respiratory function stable Cardiovascular status: stable Anesthetic complications: no     Last Vitals:  Vitals:   06/12/17 2025 06/12/17 2030  BP:  118/76  Pulse: 64 72  Resp: (!) 9 16  Temp:    SpO2: 97% 96%    Last Pain:  Vitals:   06/12/17 2030  TempSrc:   PainSc: 3                  Naksh Radi K

## 2017-06-12 NOTE — Anesthesia Preprocedure Evaluation (Signed)
Anesthesia Evaluation  Patient identified by MRN, date of birth, ID band Patient awake    Reviewed: Allergy & Precautions, H&P , NPO status , reviewed documented beta blocker date and time   Airway Mallampati: II  TM Distance: >3 FB Neck ROM: full    Dental no notable dental hx. (+) Teeth Intact   Pulmonary neg pulmonary ROS,    Pulmonary exam normal        Cardiovascular negative cardio ROS Normal cardiovascular exam     Neuro/Psych negative neurological ROS  negative psych ROS   GI/Hepatic Neg liver ROS, GERD  Controlled and Medicated,  Endo/Other  negative endocrine ROS  Renal/GU negative Renal ROS  negative genitourinary   Musculoskeletal   Abdominal   Peds  Hematology  (+) anemia ,   Anesthesia Other Findings   Reproductive/Obstetrics                             Anesthesia Physical Anesthesia Plan  ASA: II  Anesthesia Plan: General ETT   Post-op Pain Management:    Induction:   PONV Risk Score and Plan: 4 or greater and Ondansetron, Midazolam, Dexamethasone and Promethazine  Airway Management Planned:   Additional Equipment:   Intra-op Plan:   Post-operative Plan:   Informed Consent: I have reviewed the patients History and Physical, chart, labs and discussed the procedure including the risks, benefits and alternatives for the proposed anesthesia with the patient or authorized representative who has indicated his/her understanding and acceptance.   Dental Advisory Given  Plan Discussed with: CRNA  Anesthesia Plan Comments:         Anesthesia Quick Evaluation

## 2017-06-13 ENCOUNTER — Encounter: Payer: Self-pay | Admitting: Obstetrics and Gynecology

## 2017-06-16 DIAGNOSIS — K660 Peritoneal adhesions (postprocedural) (postinfection): Secondary | ICD-10-CM

## 2017-06-17 LAB — SURGICAL PATHOLOGY

## 2017-06-17 LAB — CYTOLOGY - NON PAP

## 2017-06-18 DIAGNOSIS — Z6835 Body mass index (BMI) 35.0-35.9, adult: Secondary | ICD-10-CM | POA: Insufficient documentation

## 2018-01-27 ENCOUNTER — Other Ambulatory Visit: Payer: Self-pay | Admitting: Unknown Physician Specialty

## 2018-01-27 DIAGNOSIS — H902 Conductive hearing loss, unspecified: Secondary | ICD-10-CM

## 2018-01-27 DIAGNOSIS — H8090 Unspecified otosclerosis, unspecified ear: Secondary | ICD-10-CM

## 2018-02-03 ENCOUNTER — Ambulatory Visit
Admission: RE | Admit: 2018-02-03 | Discharge: 2018-02-03 | Disposition: A | Discharge status: Home / Self Care | Payer: BLUE CROSS/BLUE SHIELD | Source: Ambulatory Visit | Attending: Unknown Physician Specialty | Admitting: Unknown Physician Specialty

## 2018-02-03 DIAGNOSIS — H902 Conductive hearing loss, unspecified: Secondary | ICD-10-CM

## 2018-02-03 DIAGNOSIS — H8090 Unspecified otosclerosis, unspecified ear: Secondary | ICD-10-CM | POA: Diagnosis present

## 2018-02-11 DIAGNOSIS — H9190 Unspecified hearing loss, unspecified ear: Secondary | ICD-10-CM | POA: Insufficient documentation

## 2018-06-29 DIAGNOSIS — R519 Headache, unspecified: Secondary | ICD-10-CM | POA: Insufficient documentation

## 2018-08-25 ENCOUNTER — Other Ambulatory Visit: Payer: Self-pay | Admitting: Family Medicine

## 2018-08-25 DIAGNOSIS — R519 Headache, unspecified: Secondary | ICD-10-CM

## 2018-09-09 ENCOUNTER — Other Ambulatory Visit: Payer: Self-pay

## 2018-09-09 ENCOUNTER — Ambulatory Visit
Admission: RE | Admit: 2018-09-09 | Discharge: 2018-09-09 | Disposition: A | Discharge status: Home / Self Care | Payer: BLUE CROSS/BLUE SHIELD | Source: Ambulatory Visit | Attending: Family Medicine | Admitting: Family Medicine

## 2018-09-09 DIAGNOSIS — R519 Headache, unspecified: Secondary | ICD-10-CM

## 2018-09-09 DIAGNOSIS — R51 Headache: Secondary | ICD-10-CM | POA: Insufficient documentation

## 2018-09-14 DIAGNOSIS — R519 Headache, unspecified: Secondary | ICD-10-CM | POA: Insufficient documentation

## 2018-09-15 ENCOUNTER — Other Ambulatory Visit: Payer: Self-pay | Admitting: Family Medicine

## 2018-09-15 ENCOUNTER — Other Ambulatory Visit (HOSPITAL_COMMUNITY): Payer: Self-pay | Admitting: Family Medicine

## 2018-09-15 DIAGNOSIS — G930 Cerebral cysts: Secondary | ICD-10-CM

## 2018-09-17 ENCOUNTER — Other Ambulatory Visit: Payer: Self-pay

## 2018-09-17 ENCOUNTER — Ambulatory Visit
Admission: RE | Admit: 2018-09-17 | Discharge: 2018-09-17 | Disposition: A | Discharge status: Home / Self Care | Payer: BLUE CROSS/BLUE SHIELD | Source: Ambulatory Visit | Attending: Family Medicine | Admitting: Family Medicine

## 2018-09-17 DIAGNOSIS — G930 Cerebral cysts: Secondary | ICD-10-CM | POA: Insufficient documentation

## 2018-09-17 MED ORDER — GADOBUTROL 1 MMOL/ML IV SOLN
10.0000 mL | Freq: Once | INTRAVENOUS | Status: AC | PRN
  Administered 2018-09-17: 10 mL via INTRAVENOUS

## 2018-09-23 ENCOUNTER — Other Ambulatory Visit: Payer: Self-pay | Admitting: Family Medicine

## 2018-09-28 ENCOUNTER — Other Ambulatory Visit: Payer: Self-pay | Admitting: Family Medicine

## 2018-09-28 DIAGNOSIS — G930 Cerebral cysts: Secondary | ICD-10-CM

## 2018-10-06 ENCOUNTER — Other Ambulatory Visit: Payer: Self-pay | Admitting: Family Medicine

## 2018-10-06 ENCOUNTER — Ambulatory Visit
Admission: RE | Admit: 2018-10-06 | Discharge: 2018-10-06 | Disposition: A | Discharge status: Home / Self Care | Payer: BLUE CROSS/BLUE SHIELD | Source: Ambulatory Visit | Attending: Family Medicine | Admitting: Family Medicine

## 2018-10-06 ENCOUNTER — Other Ambulatory Visit (HOSPITAL_COMMUNITY): Payer: Self-pay | Admitting: Family Medicine

## 2018-10-06 ENCOUNTER — Other Ambulatory Visit: Payer: Self-pay

## 2018-10-06 DIAGNOSIS — R10823 Right lower quadrant rebound abdominal tenderness: Secondary | ICD-10-CM

## 2018-10-06 MED ORDER — IOHEXOL 300 MG/ML  SOLN
100.0000 mL | Freq: Once | INTRAMUSCULAR | Status: AC | PRN
  Administered 2018-10-06: 100 mL via INTRAVENOUS

## 2018-11-30 ENCOUNTER — Ambulatory Visit: Payer: BLUE CROSS/BLUE SHIELD | Attending: Obstetrics & Gynecology

## 2018-11-30 ENCOUNTER — Other Ambulatory Visit: Payer: Self-pay

## 2018-11-30 DIAGNOSIS — M62838 Other muscle spasm: Secondary | ICD-10-CM | POA: Diagnosis present

## 2018-11-30 DIAGNOSIS — R293 Abnormal posture: Secondary | ICD-10-CM | POA: Diagnosis present

## 2018-11-30 DIAGNOSIS — M629 Disorder of muscle, unspecified: Secondary | ICD-10-CM | POA: Diagnosis present

## 2018-11-30 DIAGNOSIS — M6289 Other specified disorders of muscle: Secondary | ICD-10-CM

## 2018-11-30 NOTE — Therapy (Signed)
[place] [facility] MAIN [facility] SERVICES [address] [place], [place], [address] Phone: [phone]   Fax:  [phone]  Physical Therapy Evaluation  The patient has been informed of current processes in place at Outpatient Rehab to protect patients from Covid-19 exposure including social distancing, schedule modifications, and new cleaning procedures. After discussing their particular risk with a therapist based on the patient's personal risk factors, the patient has decided to proceed with in-person therapy.   Patient Details  Name: Andrea Baird MRN: [id number] Date of Birth: 02/04/1971 Referring Provider (PT): Heloise Ochoa   Encounter Date: 11/30/2018  PT End of Session - 12/01/18 1239    Visit Number  1    Number of Visits  10    Date for PT Re-Evaluation  02/09/19    Authorization Type  [employer]    Authorization Time Period  through 02/09/2019    Authorization - Visit Number  1    Authorization - Number of Visits  10    PT Start Time  1000    PT Stop Time  1100    PT Time Calculation (min)  60 min    Activity Tolerance  Patient tolerated treatment well;No increased pain    Behavior During Therapy  WFL for tasks assessed/performed       Past Medical History:  Diagnosis Date  . Anemia   . Chronic headaches 2018  . Elevated cholesterol   . GERD (gastroesophageal reflux disease)   . Otosclerosis, right 2019    Past Surgical History:  Procedure Laterality Date  . ABDOMINAL HYSTERECTOMY    . COLONOSCOPY, ESOPHAGOGASTRODUODENOSCOPY (EGD) AND ESOPHAGEAL DILATION    . ESOPHAGOGASTRODUODENOSCOPY (EGD) WITH PROPOFOL N/A 05/05/2017   Procedure: ESOPHAGOGASTRODUODENOSCOPY (EGD) WITH PROPOFOL;  Surgeon: Toledo, Boykin Nearing, MD;  Location: [facility] ENDOSCOPY;  Service: Gastroenterology;  Laterality: N/A;  . HYSTERECTOMY ABDOMINAL WITH SALPINGECTOMY Bilateral 11/2015  . LAPAROSCOPIC ABDOMINAL EXPLORATION Bilateral 1990  . LAPAROTOMY    . laproscopy    .  LYSIS OF ADHESION N/A 06/12/2017   Procedure: LYSIS OF ADHESION;  Surgeon: Christeen Douglas, MD;  Location: [facility] ORS;  Service: Gynecology;  Laterality: N/A;  . TUBAL LIGATION      There were no vitals filed for this visit.    Pelvic Floor Physical Therapy Evaluation and Assessment  SCREENING  Falls in last 6 mo: no Red Flags:  Have you had any night sweats? no Unexplained weight loss? no Saddle anesthesia? no Unexplained changes in bowel or bladder habits? no  SUBJECTIVE  Patient reports: She has always had more pain on the R side with her period. It seemed to improve when she had a hysterectomy then got worse, then better when she had the left oophorectomy. Over the last year it has gotten much worse. ~ 2-3 years ago she feels like her body just started to "fall apart"   Precautions:  none  Social/Family/Vocational History:   Significant Peritoneal Scarring   Recent Procedures/Tests/Findings:  Ultrasound which showed 2 small cysts  Obstetrical History: 3 vaginal deliveries with significant tearing with first one  Gynecological History: Hysterectomy and oophrectomy on L due to fibroids  Urinary History: Frequency every hour or more and Nocturiax3  Gastrointestinal History: Fluctuates from constipation to mild diarrhea (her historic norm) can go 1-2 days in between BM's occasionally (when pain is higher) then becomes regular again  Sexual activity/pain: Pain is worse around a cycle with dep penetration/ certain angles.  Location of pain: RLQ and low back radiating  into R thigh Current pain:  4/10  Max pain:  10/10 Least pain:  2/10 Nature of pain: Achy, pulling, radiating  Patient Goals: Get her pain not to flare up so bad, would love to be pain free.   OBJECTIVE  Posture/Observations:  Sitting:  Standing: LLE> RLE slightly ER'd. Anterior pelvic tilt, weight posteriorly shifted/outside of foot. R PSIS high, R knee slightly bent, R fold higher than L.    Special tests:   Supine-to-long-sit: LLE long in both  Palpation: Pain with pressure at C3-5, ~ T 5, T12, L2-5 and R sacral border.  Range of Motion/Flexibilty:  Spine: RSB 1 finger to knee, LSB 3 fingers and pain in R anterior. L rot WNL, R rotation painful WNL for ROM Forward bend: 6 inches from floor, tight in calves and LB. Hips:   Strength/MMT: Deferred to follow-up LE MMT  LE MMT Left Right  Hip flex:  (L2) /5 /5  Hip ext: /5 /5  Hip abd: /5 /5  Hip add: /5 /5  Hip IR /5 /5  Hip ER /5 /5     Abdominal:  Palpation: highly TTP to R>L hip-flexors and adductors Diastasis: none  Pelvic Floor External Exam: Deferred to follow-up Introitus Appears:  Skin integrity:  Palpation: Cough: Prolapse visible?: Scar mobility:  Internal Vaginal Exam: Strength (PERF):  Symmetry: Palpation: Prolapse:   Internal Rectal Exam: Strength (PERF): Symmetry: Palpation: Prolapse:   Gait Analysis: Deferred to follow-up  Pelvic Floor Outcome Measures: PDI: 24/70 (34%), Female NIH-CPSI: 28/43 (65%) VQ: 11/33  INTERVENTIONS THIS SESSION: Self-care: Educated on the structure and function of the pelvic floor in relation to their symptoms as well as the POC, and initial HEP in order to set patient expectations and understanding from which we will build on in the future sessions. Educated on beginning diaphragmatic breathing to facilitate future interventions and decrease resting tension.   Total time: 60 min.       [facility] PT Assessment - 12/01/18 0001      Assessment   Medical Diagnosis  Pelvic Pain    Referring Provider (PT)  Lurena Joiner McVey    Onset Date/Surgical Date  04/09/94    Next MD Visit  march 2019    Prior Therapy  none      Precautions   Precautions  None      Restrictions   Weight Bearing Restrictions  No      Home Environment   Living Environment  Private residence    Living Arrangements  Spouse/significant other    Type of Home  House    Home  Access  Stairs to enter    Entrance Stairs-Number of Steps  5    Entrance Stairs-Rails  Right;Left    Home Layout  One level      Prior Function   Level of Independence  Independent    Vocation  Full time employment;Self employed    Vocation Requirements  --   computer work   Leisure  walking daily                Objective measurements completed on examination: See above findings.                PT Short Term Goals - 12/01/18 1617      PT SHORT TERM GOAL #1   Title  Patient will demonstrate a coordinated contraction, relaxation, and bulge of the pelvic floor muscles to demonstrate functional recruitment and motion and allow for further strengthening.  Baseline  Pt. Report and prior exam suggests PFM spasms and decreased ROM and coordination.    Time  5    Period  Weeks    Status  New    Target Date  01/05/19      PT SHORT TERM GOAL #2   Title  Patient will demonstrate HEP x1 in the clinic to demonstrate understanding and proper form to allow for further improvement.    Time  5    Period  Weeks    Status  New    Target Date  01/05/19      PT SHORT TERM GOAL #3   Title  Patient will demonstrate improved pelvic allignment in standing to allow for improved muscular balance and to allow relaxation of the PFM for decreased pain with intercourse.    Baseline  R up-slip    Time  5    Period  Weeks    Status  New    Target Date  01/05/19      PT SHORT TERM GOAL #4   Title  Pt. will demonstrate consistent self MFR to lower abdomen and posterior fourchette to allow for scar lengthening and decreased tension on nerves.    Baseline  Pt. not performing MFR    Time  5    Period  Weeks    Status  New    Target Date  01/05/19        PT Long Term Goals - 12/01/18 1620      PT LONG TERM GOAL #1   Title  Patient will score less than or equal to 20% on the Female NIH-CPSI, 15% on the PDI, and 15% on the VQ to demonstrate a reduction in pain, urinary symptoms,  and an improved quality of life.    Baseline  VQ: 11/33 (33%),  PDI: 24/70 (34%), Female NIH-CPSI: 28/43 (65%)    Time  10    Period  Weeks    Status  New    Target Date  02/09/19      PT LONG TERM GOAL #2   Title  Patient will report no pain with intercourse to demonstrate improved functional ability.    Baseline  pain with both penetration and deeper thrusting at certain angles, worse around her cycle.    Time  10    Period  Weeks    Status  New    Target Date  02/09/19      PT LONG TERM GOAL #3   Title  Patient will report urinating 6-8 times per day over the course of the prior week to demonstrate decreased frequency.    Baseline  Pt. urinating every hour or more often, Nocturiax3    Time  10    Period  Weeks    Status  New    Target Date  02/09/19             Plan - 12/01/18 1240    Clinical Impression Statement  Pt. is a 48 y/o female who presents today with cheif c/o pain in the RLQ, LB abd radiating into RLE as well as dyspareunia, Urinary frequency and Nocturia. Her PMH is most significant for Fibroids, resulting hysterectomy and L oophrectomy, as well as history of significant abdominal/pelvic adhesions as well as R otosclerosis, GERD, and chronic headaches. Her clinical exam revealed decreased scar mobility in the RLQ, a R up-slip with spasms surrounding R>L pelvis and pain throughout ~ 80% of the spine with grade 2 mobs. She will  benefit from skilled pelvic PT to address the noted defecits and to continue to assess for and address other potential causes of pain and Sx.    Personal Factors and Comorbidities  Comorbidity 3+    Comorbidities  Pelvic adhesions, GERD, h/o fibroids, chronic HA, R otosclerosis.    Examination-Activity Limitations  Transfers;Toileting;Stand;Bend;Carry;Sit;Locomotion Level;Dressing;Squat    Examination-Participation Restrictions  Cleaning;Meal Prep;Community Activity;Interpersonal Relationship;Laundry    Stability/Clinical Decision Making   Unstable/Unpredictable    Clinical Decision Making  High    Rehab Potential  Good    Clinical Impairments Affecting Rehab Potential  ovarian cysts, largely untreated chronic pain since 1996    PT Frequency  1x / week    PT Duration  Other (comment)   10 weeks   PT Treatment/Interventions  ADLs/Self Care Home Management;Biofeedback;Aquatic Therapy;Electrical Stimulation;Traction;Moist Heat;Functional mobility training;Neuromuscular re-education;Therapeutic exercise;Therapeutic activities;Patient/family education;Manual techniques;Dry needling;Scar mobilization;Passive range of motion;Taping;Ultrasound;Joint Manipulations;Spinal Manipulations    PT Next Visit Plan  TPDN vs. TP release to B hip-flexors and fascial mobility to RLQ    Consulted and Agree with Plan of Care  Patient       Patient will benefit from skilled therapeutic intervention in order to improve the following deficits and impairments:  Increased fascial restricitons, Improper body mechanics, Pain, Decreased coordination, Decreased scar mobility, Increased muscle spasms, Impaired tone, Postural dysfunction, Decreased activity tolerance, Decreased range of motion, Decreased strength, Hypomobility, Difficulty walking, Impaired flexibility, Impaired vision/preception  Visit Diagnosis: Other muscle spasm  Abnormal posture  Muscular imbalance     Problem List Patient Active Problem List   Diagnosis Date Noted  . Peritoneal adhesions    Willa Rough DPT, ATC Willa Rough 12/01/2018, 4:30 PM  [place] MAIN [facility] SERVICES [address] [place], [place], [address] Phone: [phone]   Fax:  [phone]  Name: Andrea Baird MRN: [id number] Date of Birth: May 02, 1970

## 2018-11-30 NOTE — Patient Instructions (Signed)
   Stabilization: Diaphragmatic Breathing    Lie with knees bent, feet flat. Place one hand on stomach, other on chest. Breathe deeply through nose, lifting belly hand without any motion of hand on chest.  Do this for at least 5 min. Per night and as often as you can remember throughout the day.

## 2018-12-07 ENCOUNTER — Ambulatory Visit: Payer: BLUE CROSS/BLUE SHIELD

## 2018-12-07 ENCOUNTER — Other Ambulatory Visit: Payer: Self-pay

## 2018-12-07 DIAGNOSIS — R293 Abnormal posture: Secondary | ICD-10-CM

## 2018-12-07 DIAGNOSIS — M62838 Other muscle spasm: Secondary | ICD-10-CM | POA: Diagnosis not present

## 2018-12-07 DIAGNOSIS — M629 Disorder of muscle, unspecified: Secondary | ICD-10-CM

## 2018-12-07 DIAGNOSIS — M6289 Other specified disorders of muscle: Secondary | ICD-10-CM

## 2018-12-07 NOTE — Patient Instructions (Signed)
     Bring both knees up to your chest and then hold the one farthest from the edge of the table/bed and let the other relax toward the floor until stretch is felt through the front of the hip.   Hold-relax: Gently lift the knee of the down leg up ~ 1/2 and inch and hold for 5 seconds, then let it relax all the way for a second and repeat 4 more times to decrease resting tension in the muscle.   Stretch: Let the leg relax and feel a stretch across the front of the hip/thigh as you take belly breaths. Hold for __5__ deep belly breaths. Relax. Repeat __2-3__ times per side.    Do this __1-2__ times per day.     Hold for 30 seconds (5 deep breaths) and repeat 2 times with the L arm over,3 times with the R arm over, once a day

## 2018-12-07 NOTE — Therapy (Signed)
[place] MAIN [facility] SERVICES [address] [place], [place], [address] Phone: [phone]   Fax:  [phone]  Physical Therapy Treatment  The patient has been informed of current processes in place at Outpatient Rehab to protect patients from Covid-19 exposure including social distancing, schedule modifications, and new cleaning procedures. After discussing their particular risk with a therapist based on the patient's personal risk factors, the patient has decided to proceed with in-person therapy.   Patient Details  Name: Andrea Baird MRN: [id number] Date of Birth: December 18, 1970 Referring Provider (PT): Hassan Buckler   Encounter Date: 12/07/2018  PT End of Session - 12/07/18 0918    Visit Number  2    Number of Visits  10    Date for PT Re-Evaluation  02/09/19    Authorization Type  BCBS    Authorization Time Period  through 02/09/2019    Authorization - Visit Number  2    Authorization - Number of Visits  10    PT Start Time  0830    PT Stop Time  0923    PT Time Calculation (min)  53 min    Activity Tolerance  Patient tolerated treatment well;No increased pain    Behavior During Therapy  WFL for tasks assessed/performed       Past Medical History:  Diagnosis Date  . Anemia   . Chronic headaches 2018  . Elevated cholesterol   . GERD (gastroesophageal reflux disease)   . Otosclerosis, right 2019    Past Surgical History:  Procedure Laterality Date  . ABDOMINAL HYSTERECTOMY    . COLONOSCOPY, ESOPHAGOGASTRODUODENOSCOPY (EGD) AND ESOPHAGEAL DILATION    . ESOPHAGOGASTRODUODENOSCOPY (EGD) WITH PROPOFOL N/A 05/05/2017   Procedure: ESOPHAGOGASTRODUODENOSCOPY (EGD) WITH PROPOFOL;  Surgeon: Toledo, Benay Pike, MD;  Location: [facility];  Service: Gastroenterology;  Laterality: N/A;  . HYSTERECTOMY ABDOMINAL WITH SALPINGECTOMY Bilateral 11/2015  . LAPAROSCOPIC ABDOMINAL EXPLORATION Bilateral 1990  . LAPAROTOMY    . laproscopy    .  LYSIS OF ADHESION N/A 06/12/2017   Procedure: LYSIS OF ADHESION;  Surgeon: Benjaman Kindler, MD;  Location: [facility] ORS;  Service: Gynecology;  Laterality: N/A;  . TUBAL LIGATION      There were no vitals filed for this visit.   Pelvic Floor Physical Therapy Treatment Note  SCREENING  Changes in medications, allergies, or medical history?: none    SUBJECTIVE  Patient reports: Had an ultrasound and it increased her pain for ~ 2 days.   Precautions:  none  Pain update:  Location of pain: RLQ Current pain:  1/10  Max pain:  8/10 Least pain:  2/10 Nature of pain: achy/dull  Patient Goals: Get her pain not to flare up so bad, would love to be pain free.   OBJECTIVE  Changes in: Posture/Observations:  R up-slip  Range of Motion/Flexibilty:  Decreased R>L lateral flexion   Abdominal:  Decreased fascial mobility through RLQ. Most restriction in caudal direction, familiar pain with deep palpation in RLQ.  Palpation: TTP to R Psoas and Iliacus.  INTERVENTIONS THIS SESSION: Manual: TP release to R Psoas and Iliacus and MFR to RLQ to decrease spasm and pain and allow for improved balance of musculature for improved function and decreased symptoms.  Therex: Educated on and practiced hip-flexor hold-release stretch and side-stretch To maintain and improve muscle length and allow for improved balance of musculature for long-term symptom relief.   Total time: 53 min.  PT Short Term Goals - 12/01/18 1617      PT SHORT TERM GOAL #1   Title  Patient will demonstrate a coordinated contraction, relaxation, and bulge of the pelvic floor muscles to demonstrate functional recruitment and motion and allow for further strengthening.    Baseline  Pt. Report and prior exam suggests PFM spasms and decreased ROM and coordination.    Time  5    Period  Weeks    Status  New    Target Date  01/05/19      PT SHORT TERM GOAL #2   Title   Patient will demonstrate HEP x1 in the clinic to demonstrate understanding and proper form to allow for further improvement.    Time  5    Period  Weeks    Status  New    Target Date  01/05/19      PT SHORT TERM GOAL #3   Title  Patient will demonstrate improved pelvic allignment in standing to allow for improved muscular balance and to allow relaxation of the PFM for decreased pain with intercourse.    Baseline  R up-slip    Time  5    Period  Weeks    Status  New    Target Date  01/05/19      PT SHORT TERM GOAL #4   Title  Pt. will demonstrate consistent self MFR to lower abdomen and posterior fourchette to allow for scar lengthening and decreased tension on nerves.    Baseline  Pt. not performing MFR    Time  5    Period  Weeks    Status  New    Target Date  01/05/19        PT Long Term Goals - 12/01/18 1620      PT LONG TERM GOAL #1   Title  Patient will score less than or equal to 20% on the Female NIH-CPSI, 15% on the PDI, and 15% on the VQ to demonstrate a reduction in pain, urinary symptoms, and an improved quality of life.    Baseline  VQ: 11/33 (33%),  PDI: 24/70 (34%), Female NIH-CPSI: 28/43 (65%)    Time  10    Period  Weeks    Status  New    Target Date  02/09/19      PT LONG TERM GOAL #2   Title  Patient will report no pain with intercourse to demonstrate improved functional ability.    Baseline  pain with both penetration and deeper thrusting at certain angles, worse around her cycle.    Time  10    Period  Weeks    Status  New    Target Date  02/09/19      PT LONG TERM GOAL #3   Title  Patient will report urinating 6-8 times per day over the course of the prior week to demonstrate decreased frequency.    Baseline  Pt. urinating every hour or more often, Nocturiax3    Time  10    Period  Weeks    Status  New    Target Date  02/09/19            Plan - 12/07/18 [id number]    Clinical Impression Statement  Pt. Responded well to all interventions  today, demonstrating decreased spasms and improved fascial mobility as well as understanding and correct performance of all education and exercises provided today. They will continue to benefit from skilled physical therapy to work toward remaining  goals and maximize function as well as decrease likelihood of symptom increase or recurrence.    PT Next Visit Plan  TPDN vs. TP release to L hip-flexors (r iliacus?) and further fascial mobility to RLQ    PT Home Exercise Plan  Squatty potty, and diaphragmatic breathing, side-stretch, hip-flexor hold-relax stretch    Consulted and Agree with Plan of Care  Patient       Patient will benefit from skilled therapeutic intervention in order to improve the following deficits and impairments:     Visit Diagnosis: Other muscle spasm  Abnormal posture  Muscular imbalance     Problem List Patient Active Problem List   Diagnosis Date Noted  . Peritoneal adhesions    Cleophus Molt DPT, ATC Cleophus Molt 12/07/2018, 9:29 AM  [place] [facility] [place] MAIN [facility] SERVICES [address] [place], [place], [address] Phone: [phone]   Fax:  [phone]  Name: Andrea Baird MRN: [id number] Date of Birth: 1970-06-26

## 2018-12-14 ENCOUNTER — Other Ambulatory Visit: Payer: Self-pay

## 2018-12-14 ENCOUNTER — Ambulatory Visit: Payer: BLUE CROSS/BLUE SHIELD | Attending: Obstetrics & Gynecology

## 2018-12-14 DIAGNOSIS — M629 Disorder of muscle, unspecified: Secondary | ICD-10-CM

## 2018-12-14 DIAGNOSIS — R293 Abnormal posture: Secondary | ICD-10-CM | POA: Diagnosis present

## 2018-12-14 DIAGNOSIS — M6289 Other specified disorders of muscle: Secondary | ICD-10-CM

## 2018-12-14 DIAGNOSIS — R278 Other lack of coordination: Secondary | ICD-10-CM | POA: Diagnosis present

## 2018-12-14 DIAGNOSIS — M62838 Other muscle spasm: Secondary | ICD-10-CM | POA: Insufficient documentation

## 2018-12-14 DIAGNOSIS — M791 Myalgia, unspecified site: Secondary | ICD-10-CM | POA: Insufficient documentation

## 2018-12-14 NOTE — Patient Instructions (Addendum)
As you start wearing your heel-lift only wear it for an hour the first day and increase by an hour each day so you can allow for the body to adapt to the change easily without much pain. If your pain increases by more than 1-2 points, back off slightly or slow down how quickly you increase your wear time. Once you reach a full day of wear, use it as much as possible forever, even in house-shoes or flip-flops if necessary to keep yourself from reverting to bad pelvic and spinal alignment and having symptoms return.    Adjust-a-lift heel lift Can be found at [email or web address]     Do _1-2__ times per day.   Sit your bottom at the edge of the bed, pull your knee into your chest and keep it close as you lie back, feel the stretch down the front of the leg that is down. Hold for 5 deep breaths, repease 2-3 times for each side 1-2 times per day. You may use a towel to help hold the knee to your chest if needed.

## 2018-12-14 NOTE — Therapy (Signed)
[place] MAIN [facility] SERVICES [address] [place], [place], [address] Phone: [phone]   Fax:  [phone]  Physical Therapy Treatment The patient has been informed of current processes in place at Outpatient Rehab to protect patients from Covid-19 exposure including social distancing, schedule modifications, and new cleaning procedures. After discussing their particular risk with a therapist based on the patient's personal risk factors, the patient has decided to proceed with in-person therapy.  Patient Details  Name: Andrea Baird MRN: [id number] Date of Birth: 11-Jan-1971 Referring Provider (PT): Hassan Buckler   Encounter Date: 12/14/2018    Past Medical History:  Diagnosis Date  . Anemia   . Chronic headaches 2018  . Elevated cholesterol   . GERD (gastroesophageal reflux disease)   . Otosclerosis, right 2019    Past Surgical History:  Procedure Laterality Date  . ABDOMINAL HYSTERECTOMY    . COLONOSCOPY, ESOPHAGOGASTRODUODENOSCOPY (EGD) AND ESOPHAGEAL DILATION    . ESOPHAGOGASTRODUODENOSCOPY (EGD) WITH PROPOFOL N/A 05/05/2017   Procedure: ESOPHAGOGASTRODUODENOSCOPY (EGD) WITH PROPOFOL;  Surgeon: Toledo, Benay Pike, MD;  Location: [facility];  Service: Gastroenterology;  Laterality: N/A;  . HYSTERECTOMY ABDOMINAL WITH SALPINGECTOMY Bilateral 11/2015  . LAPAROSCOPIC ABDOMINAL EXPLORATION Bilateral 1990  . LAPAROTOMY    . laproscopy    . LYSIS OF ADHESION N/A 06/12/2017   Procedure: LYSIS OF ADHESION;  Surgeon: Benjaman Kindler, MD;  Location: [facility] ORS;  Service: Gynecology;  Laterality: N/A;  . TUBAL LIGATION      There were no vitals filed for this visit.    Pelvic Floor Physical Therapy Treatment Note  SCREENING  Changes in medications, allergies, or medical history?: none    SUBJECTIVE  Patient reports: Patient has been sitting a lot due to work in the last two days. Pt states pain still increases with standing,  walking and sex. Pain continues to increase around menstrual cycle.   Precautions:  none  Pain update:  Location of pain: RLQ Current pain:  4/10  Max pain:  4/10 Least pain:  3/10 Nature of pain: achy/dull -- patient states less frequent increases in pain, however it has a more constant lower level of ache.   Patient Goals: Get her pain not to flare up so bad, would love to be pain free.   OBJECTIVE  Changes in: Posture/Observations:  R longer than L this session, which is not consistent with previous findings.  R ASIS is up-slipped.   R longer than L in LLD.  Range of Motion/Flexibilty:  Decreased R>L lateral flexion   Abdominal:  Decreased fascial mobility through RLQ. Most restriction in caudal direction, familiar pain with deep palpation in RLQ.  Palpation: TTP to R Psoas and Iliacus. TTP to L Psoas and Iliacus.   Beighton Score  Little Finger: L-0, R-0- of note: used to be able to  Thumb:         L-0, R-0 - of note: used to be able to  Elbow:          L-1 , R-1 Knee:            L-0, R-1 Touch Floor: Yes   No  Total: 3/9  Positive = > 6/9 for youth, >5/9 for adults   INTERVENTIONS THIS SESSION: Manual: TP release to L Psoas and Illiacus, pt reported referring pain to the RLQ, performed to decrease spasm and pain and allow for improved balance of musculature for improved function and decreased symptoms. Majority of time spent on manual therapy this session  due to the fascial restrictions possibly limiting pain management.    Therex: Educated on and practiced alternative hip flexor stretch (thomas test at end of bed) to maintain and improve muscle length and allow for improved balance of musculature for long-term symptom relief.   Total time: 60 min.                            PT Short Term Goals - 12/01/18 1617      PT SHORT TERM GOAL #1   Title  Patient will demonstrate a coordinated contraction, relaxation, and bulge of the  pelvic floor muscles to demonstrate functional recruitment and motion and allow for further strengthening.    Baseline  Pt. Report and prior exam suggests PFM spasms and decreased ROM and coordination.    Time  5    Period  Weeks    Status  New    Target Date  01/05/19      PT SHORT TERM GOAL #2   Title  Patient will demonstrate HEP x1 in the clinic to demonstrate understanding and proper form to allow for further improvement.    Time  5    Period  Weeks    Status  New    Target Date  01/05/19      PT SHORT TERM GOAL #3   Title  Patient will demonstrate improved pelvic allignment in standing to allow for improved muscular balance and to allow relaxation of the PFM for decreased pain with intercourse.    Baseline  R up-slip    Time  5    Period  Weeks    Status  New    Target Date  01/05/19      PT SHORT TERM GOAL #4   Title  Pt. will demonstrate consistent self MFR to lower abdomen and posterior fourchette to allow for scar lengthening and decreased tension on nerves.    Baseline  Pt. not performing MFR    Time  5    Period  Weeks    Status  New    Target Date  01/05/19        PT Long Term Goals - 12/01/18 1620      PT LONG TERM GOAL #1   Title  Patient will score less than or equal to 20% on the Female NIH-CPSI, 15% on the PDI, and 15% on the VQ to demonstrate a reduction in pain, urinary symptoms, and an improved quality of life.    Baseline  VQ: 11/33 (33%),  PDI: 24/70 (34%), Female NIH-CPSI: 28/43 (65%)    Time  10    Period  Weeks    Status  New    Target Date  02/09/19      PT LONG TERM GOAL #2   Title  Patient will report no pain with intercourse to demonstrate improved functional ability.    Baseline  pain with both penetration and deeper thrusting at certain angles, worse around her cycle.    Time  10    Period  Weeks    Status  New    Target Date  02/09/19      PT LONG TERM GOAL #3   Title  Patient will report urinating 6-8 times per day over the course  of the prior week to demonstrate decreased frequency.    Baseline  Pt. urinating every hour or more often, Nocturiax3    Time  10    Period  Weeks    Status  New    Target Date  02/09/19              Patient will benefit from skilled therapeutic intervention in order to improve the following deficits and impairments:     Visit Diagnosis: No diagnosis found.     Problem List Patient Active Problem List   Diagnosis Date Noted  . Peritoneal adhesions    Cleophus MoltKeeli T.  DPT, ATC Cleophus MoltKeeli T  12/14/2018, 1:35 PM  [facility] [address] [place], [place], [address] Phone: [phone]   Fax:  [phone]  Name: Bonnielee HaffSusan J Dorsey MRN: [id number] Date of Birth: [birth year]

## 2018-12-20 ENCOUNTER — Other Ambulatory Visit: Payer: Self-pay

## 2018-12-20 ENCOUNTER — Ambulatory Visit: Payer: BLUE CROSS/BLUE SHIELD

## 2018-12-20 DIAGNOSIS — M629 Disorder of muscle, unspecified: Secondary | ICD-10-CM

## 2018-12-20 DIAGNOSIS — R293 Abnormal posture: Secondary | ICD-10-CM

## 2018-12-20 DIAGNOSIS — M791 Myalgia, unspecified site: Secondary | ICD-10-CM

## 2018-12-20 DIAGNOSIS — R278 Other lack of coordination: Secondary | ICD-10-CM

## 2018-12-20 DIAGNOSIS — M62838 Other muscle spasm: Secondary | ICD-10-CM | POA: Diagnosis not present

## 2018-12-20 DIAGNOSIS — M6289 Other specified disorders of muscle: Secondary | ICD-10-CM

## 2018-12-20 NOTE — Patient Instructions (Signed)
  Pelvic Rotation: Contract / Relax (Supine)  MET to Correct Right Anteriorly Rotated/Left Posteriorly Rotated Innominate   Begin laying on your back with your feet at 90 degrees. Put a dowel/broomstick  through your legs, behind your right knee and in front of your left knee. Stabilize the dowel on ether side with your hands.  Press down with the right leg and up with the left leg. Hold for 5 seconds  then slowly relax. Repeat 5 times.    

## 2018-12-20 NOTE — Therapy (Signed)
[place] MAIN [facility] SERVICES [address] [place], [place], [address] Phone: [phone]   Fax:  [phone]  Physical Therapy Treatment  The patient has been informed of current processes in place at Outpatient Rehab to protect patients from Covid-19 exposure including social distancing, schedule modifications, and new cleaning procedures. After discussing their particular risk with a therapist based on the patient's personal risk factors, the patient has decided to proceed with in-person therapy.   Patient Details  Name: Andrea Baird MRN: [id number] Date of Birth: [birth year] Referring Provider (PT): Hassan Buckler   Encounter Date: 12/20/2018  PT End of Session - 12/20/18 0805    Visit Number  4    Number of Visits  10    Date for PT Re-Evaluation  02/09/19    Authorization Type  BCBS    Authorization Time Period  through 02/09/2019    Authorization - Visit Number  4    Authorization - Number of Visits  10    PT Start Time  3557    PT Stop Time  0833    PT Time Calculation (min)  60 min    Activity Tolerance  Patient tolerated treatment well;Other (comment)   mild pain increase from MFR/ scar tissue release   Behavior During Therapy  [facility] for tasks assessed/performed       Past Medical History:  Diagnosis Date  . Anemia   . Chronic headaches 2018  . Elevated cholesterol   . GERD (gastroesophageal reflux disease)   . Otosclerosis, right 2019    Past Surgical History:  Procedure Laterality Date  . ABDOMINAL HYSTERECTOMY    . COLONOSCOPY, ESOPHAGOGASTRODUODENOSCOPY (EGD) AND ESOPHAGEAL DILATION    . ESOPHAGOGASTRODUODENOSCOPY (EGD) WITH PROPOFOL N/A 05/05/2017   Procedure: ESOPHAGOGASTRODUODENOSCOPY (EGD) WITH PROPOFOL;  Surgeon: Toledo, Benay Pike, MD;  Location: [facility];  Service: Gastroenterology;  Laterality: N/A;  . HYSTERECTOMY ABDOMINAL WITH SALPINGECTOMY Bilateral 11/2015  . LAPAROSCOPIC ABDOMINAL EXPLORATION  Bilateral 1990  . LAPAROTOMY    . laproscopy    . LYSIS OF ADHESION N/A 06/12/2017   Procedure: LYSIS OF ADHESION;  Surgeon: Benjaman Kindler, MD;  Location: [facility] ORS;  Service: Gynecology;  Laterality: N/A;  . TUBAL LIGATION      There were no vitals filed for this visit.       Pelvic Floor Physical Therapy Treatment Note  SCREENING  Changes in medications, allergies, or medical history?: none    SUBJECTIVE  Patient reports: Pain was higher after last session but it eased off the next day and has been ok since, feels the heel-lift is helping her walk better. She is at the beginning of her pain cycle and it usually progressively gets worse from today forward.   Precautions:  none  Pain update:  Location of pain: RLQ Current pain:  5/10  Max pain:  7/10 Least pain:  3/10 Nature of pain: achy/dull   Patient Goals: Get her pain not to flare up so bad, would love to be pain free.   OBJECTIVE  Changes in: Posture/Observations:  R PSIS high, R ASIS low.  Supine-to-long-sit: R longer than L in supine, near even in sitting.  Abdominal:  Decreased fascial mobility through RLQ. Most restriction in caudal direction, familiar pain with deep palpation in RLQ.  Palpation: TTP to R Psoas and Iliacus. TTP to L Psoas and Iliacus.  TTP to R Rectus abdominus   INTERVENTIONS THIS SESSION: Manual: TP release to R Psoas, Illiacus, and Rectus abdominus pt  reported referring pain to the LLQ, performed to decrease spasm and pain and allow for improved balance of musculature for improved function and decreased symptoms. MFR performed to RLQ with skin pull in cephalic and lateral to medial directions. Majority of time spent on manual therapy this session due to the fascial restrictions restricting ability to directly address deeper tissue and limiting pain management.     Therex: Educated on and practiced self MET correction with broomstick to decrease pelvic obliquity and allow for  decreased pressure on nerves in the lumbosacral region and decreased pain.  Total time: 60 min.                         PT Short Term Goals - 12/01/18 1617      PT SHORT TERM GOAL #1   Title  Patient will demonstrate a coordinated contraction, relaxation, and bulge of the pelvic floor muscles to demonstrate functional recruitment and motion and allow for further strengthening.    Baseline  Pt. Report and prior exam suggests PFM spasms and decreased ROM and coordination.    Time  5    Period  Weeks    Status  New    Target Date  01/05/19      PT SHORT TERM GOAL #2   Title  Patient will demonstrate HEP x1 in the clinic to demonstrate understanding and proper form to allow for further improvement.    Time  5    Period  Weeks    Status  New    Target Date  01/05/19      PT SHORT TERM GOAL #3   Title  Patient will demonstrate improved pelvic allignment in standing to allow for improved muscular balance and to allow relaxation of the PFM for decreased pain with intercourse.    Baseline  R up-slip    Time  5    Period  Weeks    Status  New    Target Date  01/05/19      PT SHORT TERM GOAL #4   Title  Pt. will demonstrate consistent self MFR to lower abdomen and posterior fourchette to allow for scar lengthening and decreased tension on nerves.    Baseline  Pt. not performing MFR    Time  5    Period  Weeks    Status  New    Target Date  01/05/19        PT Long Term Goals - 12/01/18 1620      PT LONG TERM GOAL #1   Title  Patient will score less than or equal to 20% on the Female NIH-CPSI, 15% on the [place], and 15% on the VQ to demonstrate a reduction in pain, urinary symptoms, and an improved quality of life.    Baseline  VQ: 11/33 (33%),  PDI: 24/70 (34%), Female NIH-CPSI: 28/43 (65%)    Time  10    Period  Weeks    Status  New    Target Date  02/09/19      PT LONG TERM GOAL #2   Title  Patient will report no pain with intercourse to demonstrate  improved functional ability.    Baseline  pain with both penetration and deeper thrusting at certain angles, worse around her cycle.    Time  10    Period  Weeks    Status  New    Target Date  02/09/19      PT LONG TERM GOAL #  3   Title  Patient will report urinating 6-8 times per day over the course of the prior week to demonstrate decreased frequency.    Baseline  Pt. urinating every hour or more often, Nocturiax3    Time  10    Period  Weeks    Status  New    Target Date  02/09/19            Plan - 12/20/18 9417    Clinical Impression Statement  Pt. Responded well to all interventions today, demonstrating improved fascial mobility and decreased spasm as well as understanding and correct performance of all education and exercises provided today. She continues to have tenderness and pain is slightly irritated due to extent of sensitivity and involvement of scar tissue in the abdomen. They will continue to benefit from skilled physical therapy to work toward remaining goals and maximize function as well as decrease likelihood of symptom increase or recurrence.     Personal Factors and Comorbidities  Comorbidity 3+    Comorbidities  Pelvic adhesions, GERD, h/o fibroids, chronic HA, R otosclerosis.    Examination-Activity Limitations  Transfers;Toileting;Stand;Bend;Carry;Sit;Locomotion Level;Dressing;Squat    Examination-Participation Restrictions  Cleaning;Meal Prep;Community Activity;Interpersonal Relationship;Laundry    Stability/Clinical Decision Making  Unstable/Unpredictable    Rehab Potential  Good    Clinical Impairments Affecting Rehab Potential  ovarian cysts, largely untreated chronic pain since 1996    PT Frequency  1x / week    PT Duration  Other (comment)    PT Treatment/Interventions  ADLs/Self Care Home Management;Biofeedback;Aquatic Therapy;Electrical Stimulation;Traction;Moist Heat;Functional mobility training;Neuromuscular re-education;Therapeutic  exercise;Therapeutic activities;Patient/family education;Manual techniques;Dry needling;Scar mobilization;Passive range of motion;Taping;Ultrasound;Joint Manipulations;Spinal Manipulations    PT Next Visit Plan  re-check pelvic obliquity and address and further fascial mobility to RLQ    PT Home Exercise Plan  Squatty potty, and diaphragmatic breathing, side-stretch, hip-flexor hold-relax stretch (given modification 12-14-18). Heel lift given 12-14-18.    Consulted and Agree with Plan of Care  Patient       Patient will benefit from skilled therapeutic intervention in order to improve the following deficits and impairments:  Increased fascial restricitons, Improper body mechanics, Pain, Decreased coordination, Decreased scar mobility, Increased muscle spasms, Impaired tone, Postural dysfunction, Decreased activity tolerance, Decreased range of motion, Decreased strength, Hypomobility, Difficulty walking, Impaired flexibility, Impaired vision/preception  Visit Diagnosis: Other muscle spasm  Abnormal posture  Muscular imbalance  Myalgia  Other lack of coordination     Problem List Patient Active Problem List   Diagnosis Date Noted  . Peritoneal adhesions    Willa Rough DPT, ATC Willa Rough 12/20/2018, 8:43 AM  [place] MAIN [facility] SERVICES [address] [place], [place], [address] Phone: [phone]   Fax:  [phone]  Name: Andrea Baird MRN: [id number] Date of Birth: [birth year]

## 2018-12-28 ENCOUNTER — Other Ambulatory Visit: Payer: Self-pay

## 2018-12-28 ENCOUNTER — Ambulatory Visit: Payer: BLUE CROSS/BLUE SHIELD

## 2018-12-28 DIAGNOSIS — R293 Abnormal posture: Secondary | ICD-10-CM

## 2018-12-28 DIAGNOSIS — M629 Disorder of muscle, unspecified: Secondary | ICD-10-CM

## 2018-12-28 DIAGNOSIS — M6289 Other specified disorders of muscle: Secondary | ICD-10-CM

## 2018-12-28 DIAGNOSIS — M62838 Other muscle spasm: Secondary | ICD-10-CM | POA: Diagnosis not present

## 2018-12-28 NOTE — Therapy (Signed)
[facility] [facility] [address] [place], [place], [address] Phone: [phone]   Fax:  [phone]  Physical Therapy Treatment The patient has been informed of current processes in place at Outpatient Rehab to protect patients from Covid-19 exposure including social distancing, schedule modifications, and new cleaning procedures. After discussing their particular risk with a therapist based on the patient's personal risk factors, the patient has decided to proceed with in-person therapy.  Patient Details  Name: Andrea HaffSusan J Lomba MRN: [id number] Date of Birth: November 24, 1970 Referring Provider (PT): Heloise Ochoaebecca McVey   Encounter Date: 12/28/2018  PT End of Session - 12/28/18 1443    Visit Number  5    Number of Visits  10    Date for PT Re-Evaluation  02/09/19    Authorization Type  BCBS    Authorization Time Period  through 02/09/2019    Authorization - Visit Number  5    Authorization - Number of Visits  10    PT Start Time  1330    PT Stop Time  1430    PT Time Calculation (min)  60 min    Activity Tolerance  Patient tolerated treatment well;Other (comment)   mild pain increase from MFR/ scar tissue release   Behavior During Therapy  [facility] for tasks assessed/performed       Past Medical History:  Diagnosis Date  . Anemia   . Chronic headaches 2018  . Elevated cholesterol   . GERD (gastroesophageal reflux disease)   . Otosclerosis, right 2019    Past Surgical History:  Procedure Laterality Date  . ABDOMINAL HYSTERECTOMY    . COLONOSCOPY, ESOPHAGOGASTRODUODENOSCOPY (EGD) AND ESOPHAGEAL DILATION    . ESOPHAGOGASTRODUODENOSCOPY (EGD) WITH PROPOFOL N/A 05/05/2017   Procedure: ESOPHAGOGASTRODUODENOSCOPY (EGD) WITH PROPOFOL;  Surgeon: Toledo, Boykin Nearingeodoro K, MD;  Location: [facility] ENDOSCOPY;  Service: Gastroenterology;  Laterality: N/A;  . HYSTERECTOMY ABDOMINAL WITH SALPINGECTOMY Bilateral 11/2015  . LAPAROSCOPIC ABDOMINAL EXPLORATION  Bilateral 1990  . LAPAROTOMY    . laproscopy    . LYSIS OF ADHESION N/A 06/12/2017   Procedure: LYSIS OF ADHESION;  Surgeon: Christeen DouglasBeasley, Bethany, MD;  Location: [facility] ORS;  Service: Gynecology;  Laterality: N/A;  . TUBAL LIGATION      There were no vitals filed for this visit.     Pelvic Floor Physical Therapy Treatment Note  SCREENING  Changes in medications, allergies, or medical history?: none    SUBJECTIVE  Patient reports: Pain was higher after last session but it eased off through the next day and it felt better after that, increased pain related to her pain cycle, feels the heel-lift is helping her walk better. She is at the beginning of her pain cycle and it usually progressively gets worse from today forward.   Increased pain in the R LE (posterior hip- sharp and achy down to the front of the knee). Can't sleep on her sides any more - even with pillow for alignment.   Has been doing broomstick for pelvic alignment.    Precautions:  none  Pain update:  Location of pain: RLQ Current pain:  4.5-5/10  Max pain:  7-8/10 (last 2 days, took muscle relaxant, heat)  Least pain:  3.5-4/10 Nature of pain: achy/dull   Patient Goals: Get her pain not to flare up so bad, would love to be pain free.   OBJECTIVE  Changes in: Posture/Observations:  R PSIS high, R ASIS low- minimal this session.   Supine-to-long-sit: R longer than L in supine,  near even in sitting.  - absent of abnormalities after sacral mobs  Abdominal:  Decreased fascial mobility through RLQ. Decreased fascial mobility through R anterior thigh, Most restriction in caudal direction, familiar pain with deep palpation in RLQ.  Palpation: TTP to R Iliacus. TTP to R vastus lateralis and piriformis   Hypomobility with sacral counter nutation R>L.    INTERVENTIONS THIS SESSION: Manual: TP release to R Illiacus to decrease spasm and pain and allow for improved balance of musculature for improved function  and decreased symptoms.MFR performed to central lower abdominal scar with skin pull in cephalic and lateral to medial directions. Performed MFR through anterior R thigh along femoral nerve path in a cephalic direction with R foot DF/PF to decrease fascial restriction and pain. Majority of time spent on manual therapy this session due to the fascial restrictions restricting ability to directly address deeper tissue and limiting pain management. Performed MWM to B sacral borders to improve mobility of joint and surrounding connective tissue and decrease pressure on nerve roots for improved conductivity and function of down-stream tissues. Applied moist heat to RLQ following treatment to decrease post-treatment tenderness.  Self-care: educated on body-scan mindfulness exercise to decrease pain and improve Pt. Ability to self-manage pain increases at home.   Therex: Educated on and practiced leg-swings as a self sacral mobility exercise to maintain mobility and decrease SIJ dysfunction/pain   Total time: 60 min.                           PT Short Term Goals - 12/01/18 1617      PT SHORT TERM GOAL #1   Title  Patient will demonstrate a coordinated contraction, relaxation, and bulge of the pelvic floor muscles to demonstrate functional recruitment and motion and allow for further strengthening.    Baseline  Pt. Report and prior exam suggests PFM spasms and decreased ROM and coordination.    Time  5    Period  Weeks    Status  New    Target Date  01/05/19      PT SHORT TERM GOAL #2   Title  Patient will demonstrate HEP x1 in the clinic to demonstrate understanding and proper form to allow for further improvement.    Time  5    Period  Weeks    Status  New    Target Date  01/05/19      PT SHORT TERM GOAL #3   Title  Patient will demonstrate improved pelvic allignment in standing to allow for improved muscular balance and to allow relaxation of the PFM for decreased pain  with intercourse.    Baseline  R up-slip    Time  5    Period  Weeks    Status  New    Target Date  01/05/19      PT SHORT TERM GOAL #4   Title  Pt. will demonstrate consistent self MFR to lower abdomen and posterior fourchette to allow for scar lengthening and decreased tension on nerves.    Baseline  Pt. not performing MFR    Time  5    Period  Weeks    Status  New    Target Date  01/05/19        PT Long Term Goals - 12/01/18 1620      PT LONG TERM GOAL #1   Title  Patient will score less than or equal to 20% on the Female NIH-CPSI,  15% on the [place], and 15% on the VQ to demonstrate a reduction in pain, urinary symptoms, and an improved quality of life.    Baseline  VQ: 11/33 (33%),  PDI: 24/70 (34%), Female NIH-CPSI: 28/43 (65%)    Time  10    Period  Weeks    Status  New    Target Date  02/09/19      PT LONG TERM GOAL #2   Title  Patient will report no pain with intercourse to demonstrate improved functional ability.    Baseline  pain with both penetration and deeper thrusting at certain angles, worse around her cycle.    Time  10    Period  Weeks    Status  New    Target Date  02/09/19      PT LONG TERM GOAL #3   Title  Patient will report urinating 6-8 times per day over the course of the prior week to demonstrate decreased frequency.    Baseline  Pt. urinating every hour or more often, Nocturiax3    Time  10    Period  Weeks    Status  New    Target Date  02/09/19            Plan - 12/28/18 1444    Clinical Impression Statement  Pt. Responded well to all interventions today, demonstrating decreased scar mobility, improved education on mindfullness as a pain management technique and improved sacral mobility with corresponding improved pelvic alignment, as well as understanding and correct performance of all education and exercises provided today. They will continue to benefit from skilled physical therapy to work toward remaining goals and maximize function as  well as decrease likelihood of symptom increase or recurrence.    Personal Factors and Comorbidities  Comorbidity 3+    Comorbidities  Pelvic adhesions, GERD, h/o fibroids, chronic HA, R otosclerosis.    Examination-Activity Limitations  Transfers;Toileting;Stand;Bend;Carry;Sit;Locomotion Level;Dressing;Squat    Examination-Participation Restrictions  Cleaning;Meal Prep;Community Activity;Interpersonal Relationship;Laundry    Stability/Clinical Decision Making  Unstable/Unpredictable    Rehab Potential  Good    Clinical Impairments Affecting Rehab Potential  ovarian cysts, largely untreated chronic pain since 1996    PT Frequency  1x / week    PT Duration  Other (comment)    PT Treatment/Interventions  ADLs/Self Care Home Management;Biofeedback;Aquatic Therapy;Electrical Stimulation;Traction;Moist Heat;Functional mobility training;Neuromuscular re-education;Therapeutic exercise;Therapeutic activities;Patient/family education;Manual techniques;Dry needling;Scar mobilization;Passive range of motion;Taping;Ultrasound;Joint Manipulations;Spinal Manipulations    PT Next Visit Plan  and further fascial mobility to RLQ- reassess delay time; reassess mindfullness techniques; thoracolumbar mobility (side-lying rotational).    PT Home Exercise Plan  Squatty potty, and diaphragmatic breathing, side-stretch, hip-flexor hold-relax stretch (given modification 12-14-18). Heel lift given 12-14-18.    Consulted and Agree with Plan of Care  Patient       Patient will benefit from skilled therapeutic intervention in order to improve the following deficits and impairments:  Increased fascial restricitons, Improper body mechanics, Pain, Decreased coordination, Decreased scar mobility, Increased muscle spasms, Impaired tone, Postural dysfunction, Decreased activity tolerance, Decreased range of motion, Decreased strength, Hypomobility, Difficulty walking, Impaired flexibility, Impaired vision/preception  Visit  Diagnosis: Other muscle spasm  Abnormal posture  Muscular imbalance     Problem List Patient Active Problem List   Diagnosis Date Noted  . Peritoneal adhesions     Angelic Schnelle, SPT  12/28/2018, 2:54 PM   This entire session was performed under direct supervision and direction of a licensed therapist/therapist assistant . I have personally read, edited  and approve of the note as written.  Cleophus Molt DPT, ATC   [place] [facility] MAIN [facility] SERVICES [address] [place], [place], [address] Phone: [phone]   Fax:  [phone]  Name: TALMA AGUILLARD MRN: [id number] Date of Birth: 14-Jul-1970

## 2019-01-03 ENCOUNTER — Ambulatory Visit: Payer: BLUE CROSS/BLUE SHIELD

## 2019-01-03 ENCOUNTER — Other Ambulatory Visit: Payer: Self-pay

## 2019-01-03 DIAGNOSIS — M6289 Other specified disorders of muscle: Secondary | ICD-10-CM

## 2019-01-03 DIAGNOSIS — M62838 Other muscle spasm: Secondary | ICD-10-CM | POA: Diagnosis not present

## 2019-01-03 DIAGNOSIS — R293 Abnormal posture: Secondary | ICD-10-CM

## 2019-01-03 DIAGNOSIS — M629 Disorder of muscle, unspecified: Secondary | ICD-10-CM

## 2019-01-03 NOTE — Therapy (Signed)
[facility] [facility] [address] [place], [place], [address] Phone: [phone]   Fax:  [phone]  Physical Therapy Treatment The patient has been informed of current processes in place at Outpatient Rehab to protect patients from Covid-19 exposure including social distancing, schedule modifications, and new cleaning procedures. After discussing their particular risk with a therapist based on the patient's personal risk factors, the patient has decided to proceed with in-person therapy.  Patient Details  Name: Andrea HaffSusan J Baird MRN: [id number] Date of Birth: March 24, 1970 Referring Provider (PT): Heloise Ochoaebecca McVey   Encounter Date: 01/03/2019  PT End of Session - 01/03/19 0937    Visit Number  6    Number of Visits  10    Date for PT Re-Evaluation  02/09/19    Authorization Type  BCBS    Authorization Time Period  through 02/09/2019    Authorization - Visit Number  6    Authorization - Number of Visits  10    PT Start Time  0830    PT Stop Time  0930    PT Time Calculation (min)  60 min    Activity Tolerance  Patient tolerated treatment well;No increased pain    Behavior During Therapy  WFL for tasks assessed/performed       Past Medical History:  Diagnosis Date  . Anemia   . Chronic headaches 2018  . Elevated cholesterol   . GERD (gastroesophageal reflux disease)   . Otosclerosis, right 2019    Past Surgical History:  Procedure Laterality Date  . ABDOMINAL HYSTERECTOMY    . COLONOSCOPY, ESOPHAGOGASTRODUODENOSCOPY (EGD) AND ESOPHAGEAL DILATION    . ESOPHAGOGASTRODUODENOSCOPY (EGD) WITH PROPOFOL N/A 05/05/2017   Procedure: ESOPHAGOGASTRODUODENOSCOPY (EGD) WITH PROPOFOL;  Surgeon: Toledo, Boykin Nearingeodoro K, MD;  Location: [facility] END[facility];  Service: Gastroenterology;  Laterality: N/A;  . HYSTERECTOMY ABDOMINAL WITH SALPINGECTOMY Bilateral 11/2015  . LAPAROSCOPIC ABDOMINAL EXPLORATION Bilateral 1990  . LAPAROTOMY    . laproscopy    .  LYSIS OF ADHESION N/A 06/12/2017   Procedure: LYSIS OF ADHESION;  Surgeon: Christeen DouglasBeasley, Bethany, MD;  Location: [facility] ORS;  Service: Gynecology;  Laterality: N/A;  . TUBAL LIGATION      There were no vitals filed for this visit.   Pelvic Floor Physical Therapy Treatment Note  SCREENING  Changes in medications, allergies, or medical history?: none    SUBJECTIVE  Patient reports: "Feeling ok today", pain usually peaks around this time. Pt states on Saturday her pain peaked. Heat pack assisted with decreasing fascial pain. And heat-pack after session allowed for no increased following PT last session.    Precautions:  none  Pain update:  Location of pain: RLQ Current pain:  3/10  Max pain:  9/10 (last 2 days, took muscle relaxant, heat)  Least pain:  3/10 Nature of pain: achy/dull ** 3/10 on NPS- after session    Patient Goals: Get her pain not to flare up so bad, would love to be pain free.   OBJECTIVE  Changes in: Posture/Observations:  R up-slip- corrected with manual therapy.   Abdominal:  Decreased fascial mobility through RLQ. Decreased fascial mobility through R anterior thigh, Most restriction in caudal direction, familiar pain with deep palpation in RLQ. Fascial restriction along abdominal scar and LLQ.   Palpation: TTP to B QL    INTERVENTIONS THIS SESSION: Manual: MFR performed to central lower abdominal scar with skin pull in cephalic and lateral to medial directions.  MFR performed on RLQ and LLQ to decreased  fascial restriction and concordant pain. Majority of time spent on manual therapy this session due to the fascial restrictions restricting ability to directly address deeper tissue and limiting pain management.  Applied moist heat to RLQ and lower abdomen following treatment to decrease post-treatment tenderness. --Pelvic alignment improvement- performed contract/relax on R for up-slip correction. Pt required minimal cues for R QL activation (improvement  from previous sessions).   Therex: Educated and practice low back stretch for myofascial mobility, seated side-lying stretch with rotation and seated thoracic rotation. Educated and performed myofascial release (self performance) for HEP.   Total time: 60 min.                               PT Short Term Goals - 12/01/18 1617      PT SHORT TERM GOAL #1   Title  Patient will demonstrate a coordinated contraction, relaxation, and bulge of the pelvic floor muscles to demonstrate functional recruitment and motion and allow for further strengthening.    Baseline  Pt. Report and prior exam suggests PFM spasms and decreased ROM and coordination.    Time  5    Period  Weeks    Status  New    Target Date  01/05/19      PT SHORT TERM GOAL #2   Title  Patient will demonstrate HEP x1 in the clinic to demonstrate understanding and proper form to allow for further improvement.    Time  5    Period  Weeks    Status  New    Target Date  01/05/19      PT SHORT TERM GOAL #3   Title  Patient will demonstrate improved pelvic allignment in standing to allow for improved muscular balance and to allow relaxation of the PFM for decreased pain with intercourse.    Baseline  R up-slip    Time  5    Period  Weeks    Status  New    Target Date  01/05/19      PT SHORT TERM GOAL #4   Title  Pt. will demonstrate consistent self MFR to lower abdomen and posterior fourchette to allow for scar lengthening and decreased tension on nerves.    Baseline  Pt. not performing MFR    Time  5    Period  Weeks    Status  New    Target Date  01/05/19        PT Long Term Goals - 12/01/18 1620      PT LONG TERM GOAL #1   Title  Patient will score less than or equal to 20% on the Female NIH-CPSI, 15% on the [place], and 15% on the VQ to demonstrate a reduction in pain, urinary symptoms, and an improved quality of life.    Baseline  VQ: 11/33 (33%),  PDI: 24/70 (34%), Female NIH-CPSI: 28/43  (65%)    Time  10    Period  Weeks    Status  New    Target Date  02/09/19      PT LONG TERM GOAL #2   Title  Patient will report no pain with intercourse to demonstrate improved functional ability.    Baseline  pain with both penetration and deeper thrusting at certain angles, worse around her cycle.    Time  10    Period  Weeks    Status  New    Target Date  02/09/19  PT LONG TERM GOAL #3   Title  Patient will report urinating 6-8 times per day over the course of the prior week to demonstrate decreased frequency.    Baseline  Pt. urinating every hour or more often, Nocturiax3    Time  10    Period  Weeks    Status  New    Target Date  02/09/19            Plan - 01/03/19 5449    Clinical Impression Statement  Pt. Responded well to all interventions today, demonstrating improved pain management strategies (pt had no increase from PT manual work following last session), improved pelvic alignment, and decreased fascial restriction along abdoment this session (was able to progress MFR techniques this sessio) as well as understanding and correct performance of all education and exercises provided today. They will continue to benefit from skilled physical therapy to work toward remaining goals and maximize function as well as decrease likelihood of symptom increase or recurrence.    Personal Factors and Comorbidities  Comorbidity 3+    Comorbidities  Pelvic adhesions, GERD, h/o fibroids, chronic HA, R otosclerosis.    Examination-Activity Limitations  Transfers;Toileting;Stand;Bend;Carry;Sit;Locomotion Level;Dressing;Squat    Examination-Participation Restrictions  Cleaning;Meal Prep;Community Activity;Interpersonal Relationship;Laundry    Stability/Clinical Decision Making  Unstable/Unpredictable    Rehab Potential  Good    Clinical Impairments Affecting Rehab Potential  ovarian cysts, largely untreated chronic pain since 1996    PT Frequency  1x / week    PT Duration  Other  (comment)    PT Treatment/Interventions  ADLs/Self Care Home Management;Biofeedback;Aquatic Therapy;Electrical Stimulation;Traction;Moist Heat;Functional mobility training;Neuromuscular re-education;Therapeutic exercise;Therapeutic activities;Patient/family education;Manual techniques;Dry needling;Scar mobilization;Passive range of motion;Taping;Ultrasound;Joint Manipulations;Spinal Manipulations    PT Next Visit Plan  thoracolumbar mobility (side-lying rotational); self myofascial releaes techniques; progress MFR, joint mobility and assess pelvic tilt stability exercise    PT Home Exercise Plan  Squatty potty, and diaphragmatic breathing, side-stretch + rotation, hip-flexor hold-relax stretch (given modification 12-14-18). Heel lift given 12-14-18; taken out broomstick and self sacral mobs    Consulted and Agree with Plan of Care  Patient       Patient will benefit from skilled therapeutic intervention in order to improve the following deficits and impairments:  Increased fascial restricitons, Improper body mechanics, Pain, Decreased coordination, Decreased scar mobility, Increased muscle spasms, Impaired tone, Postural dysfunction, Decreased activity tolerance, Decreased range of motion, Decreased strength, Hypomobility, Difficulty walking, Impaired flexibility, Impaired vision/preception  Visit Diagnosis: Other muscle spasm  Abnormal posture  Muscular imbalance     Problem List Patient Active Problem List   Diagnosis Date Noted  . Peritoneal adhesions     Jimmey Hengel, SPT  01/03/2019, 3:27 PM  [place] [facility] MAIN [facility] SERVICES [address] [place], [place], [address] Phone: [phone]   Fax:  [phone]  Name: Andrea Baird MRN: [id number] Date of Birth: 20-Apr-1970

## 2019-01-03 NOTE — Patient Instructions (Signed)
Myofascial release (scar massage)  1. Pull up on scar with both hands gently.  2. Hold for 1 song or 20 deep breaths   Knees bent laying down low back stretch  1. While laying down have knees bent, and gently drop knees to one side, hold for 10 deep breaths, then repeat on the other side.    OR   Seated rotation  1. With feet firmly planted on the ground rotate gently to one side, hold for 5 deep breaths, then repeat on the other side.    Seated side stretch with rotation  1. While performing side stretch after the 10 deep breaths, turn palm up, and look up like you're looking at the sun, hold for 5 deep breaths, repeat on the other side.

## 2019-01-11 ENCOUNTER — Ambulatory Visit: Payer: BLUE CROSS/BLUE SHIELD

## 2019-01-18 ENCOUNTER — Ambulatory Visit: Payer: BLUE CROSS/BLUE SHIELD

## 2019-01-25 ENCOUNTER — Ambulatory Visit: Payer: BLUE CROSS/BLUE SHIELD | Attending: Obstetrics & Gynecology

## 2019-01-25 ENCOUNTER — Other Ambulatory Visit: Payer: Self-pay

## 2019-01-25 DIAGNOSIS — R278 Other lack of coordination: Secondary | ICD-10-CM | POA: Diagnosis present

## 2019-01-25 DIAGNOSIS — M629 Disorder of muscle, unspecified: Secondary | ICD-10-CM | POA: Insufficient documentation

## 2019-01-25 DIAGNOSIS — M791 Myalgia, unspecified site: Secondary | ICD-10-CM | POA: Diagnosis present

## 2019-01-25 DIAGNOSIS — M62838 Other muscle spasm: Secondary | ICD-10-CM

## 2019-01-25 DIAGNOSIS — M6289 Other specified disorders of muscle: Secondary | ICD-10-CM

## 2019-01-25 DIAGNOSIS — R293 Abnormal posture: Secondary | ICD-10-CM | POA: Diagnosis present

## 2019-01-25 NOTE — Patient Instructions (Addendum)
Planks   Leaning forward on a counter top, tuck your hips under until you feel the low tummy muscles working and keep the tuck as you bring your hips forward until your shoulder, hip, knee, and ankle are all in a straight line. Hold for ~ 10-20 sec. Or until your muscles fatigue, before you lose your good form. Repeat 3x , in as long or short of holds as is necessary to keep good form.   ** feel it in the tummy and the glutes, if you're feeling it in the low back, tuck the tummy. Breath throughout. Don't hold your breath.**   Bridges  2 sets of 10  - exhaling as you go up and down (slow)  - belly breath between reps      Just In Case - Water goal 64oz   Available in 32 oz, 1/2 gallon, or 1 gallon sizes, all colors...... To help you increase your water intake, try getting a reusable water bottle with markings to help you keep up with your hydration throughout the day. You should be drinking ~ 50% of your bodyweight in Oz. Of water each day or an average of ~ 64 Oz.  You can also add fruit or cucumbers to your water to make it more interesting and desirable. Cucumbers and honeydew are good choices because they are not acidic like citrus fruit and they add a refreshing flavor.   Trial doing belly breaths prior to going to the bathroom to relieve pressure and reduce pain.

## 2019-01-25 NOTE — Therapy (Signed)
[place] MAIN [facility] SERVICES [address] [place], [place], [address] Phone: [phone]   Fax:  [phone]  Physical Therapy Treatment The patient has been informed of current processes in place at Outpatient Rehab to protect patients from Covid-19 exposure including social distancing, schedule modifications, and new cleaning procedures. After discussing their particular risk with a therapist based on the patient's personal risk factors, the patient has decided to proceed with in-person therapy.  Patient Details  Name: Andrea Baird MRN: [id number] Date of Birth: 1970-03-24 Referring Provider (PT): Hassan Buckler   Encounter Date: 01/25/2019  PT End of Session - 01/25/19 0821    Visit Number  7    Number of Visits  10    Date for PT Re-Evaluation  02/09/19    Authorization Type  BC[employer]    Authorization Time Period  through 02/09/2019    Authorization - Visit Number  7    Authorization - Number of Visits  10    PT Start Time  0830    PT Stop Time  0930    PT Time Calculation (min)  60 min    Activity Tolerance  Patient tolerated treatment well;No increased pain    Behavior During Therapy  WFL for tasks assessed/performed       Past Medical History:  Diagnosis Date  . Anemia   . Chronic headaches 2018  . Elevated cholesterol   . GERD (gastroesophageal reflux disease)   . Otosclerosis, right 2019    Past Surgical History:  Procedure Laterality Date  . ABDOMINAL HYSTERECTOMY    . COLONOSCOPY, ESOPHAGOGASTRODUODENOSCOPY (EGD) AND ESOPHAGEAL DILATION    . ESOPHAGOGASTRODUODENOSCOPY (EGD) WITH PROPOFOL N/A 05/05/2017   Procedure: ESOPHAGOGASTRODUODENOSCOPY (EGD) WITH PROPOFOL;  Surgeon: Toledo, Benay Pike, MD;  Location: [facility] ENDOSCOPY;  Service: Gastroenterology;  Laterality: N/A;  . HYSTERECTOMY ABDOMINAL WITH SALPINGECTOMY Bilateral 11/2015  . LAPAROSCOPIC ABDOMINAL EXPLORATION Bilateral 1990  . LAPAROTOMY    . laproscopy    .  LYSIS OF ADHESION N/A 06/12/2017   Procedure: LYSIS OF ADHESION;  Surgeon: Benjaman Kindler, MD;  Location: [facility] ORS;  Service: Gynecology;  Laterality: N/A;  . TUBAL LIGATION      There were no vitals filed for this visit.   Pelvic Floor Physical Therapy Treatment Note  SCREENING  Changes in medications, allergies, or medical history?: none    SUBJECTIVE  Patient reports: Got sick a few weeks ago, and is feeling good today. Did not get tested for covid but self quarantined for 2 weeks on her own. Did have loss of smell and taste and fever. Pain has been manageable. Has been sleeping a lot which has felt good.    Precautions:  none  Pain update:  Location of pain: RLQ Current pain:  2/10  Max pain:  6/10  Least pain:  2/10 Nature of pain: achy/dull  3/10 on NPS at end of session- at low R groin.    Patient Goals: Get her pain not to flare up so bad, would love to be pain free.   OBJECTIVE  Changes in: Posture/Observations:  L up-slip- ~90% corrected with manual therapy.   Abdominal:  Decreased fascial mobility through RLQ. Decreased fascial mobility through R anterior thigh, Most restriction noted at R groin myofascially near psoas insertion.   Palpation: TTP to L QL    INTERVENTIONS THIS SESSION: Manual: L QL TP release and L QL distraction in side-lying to to decrease spasm and pain and allow for improved balance of  musculature for improved function and decreased symptoms. Pelvic alignment improved following. Myofascial release along RLQ, with moist heat placement following to decrease tension following. Myofascial release near psoas insertion. R psoas TP release to to decrease spasm and pain and allow for improved balance of musculature for improved function and decreased symptoms. (20 minutes)   Therex: Attempted hook-lying pelvic tilts, discontinued due to low level activity. Progressed to bridges, patient performed with appropriate body mechanics and  appropriate gluteal activation with fatigue. Counter-top forward planks, 3 reps. Both bridges and forward planks added to HEP. (40 minutes)   Total time: 60 min.                                PT Short Term Goals - 12/01/18 1617      PT SHORT TERM GOAL #1   Title  Patient will demonstrate a coordinated contraction, relaxation, and bulge of the pelvic floor muscles to demonstrate functional recruitment and motion and allow for further strengthening.    Baseline  Pt. Report and prior exam suggests PFM spasms and decreased ROM and coordination.    Time  5    Period  Weeks    Status  New    Target Date  01/05/19      PT SHORT TERM GOAL #2   Title  Patient will demonstrate HEP x1 in the clinic to demonstrate understanding and proper form to allow for further improvement.    Time  5    Period  Weeks    Status  New    Target Date  01/05/19      PT SHORT TERM GOAL #3   Title  Patient will demonstrate improved pelvic allignment in standing to allow for improved muscular balance and to allow relaxation of the PFM for decreased pain with intercourse.    Baseline  R up-slip    Time  5    Period  Weeks    Status  New    Target Date  01/05/19      PT SHORT TERM GOAL #4   Title  Pt. will demonstrate consistent self MFR to lower abdomen and posterior fourchette to allow for scar lengthening and decreased tension on nerves.    Baseline  Pt. not performing MFR    Time  5    Period  Weeks    Status  New    Target Date  01/05/19        PT Long Term Goals - 12/01/18 1620      PT LONG TERM GOAL #1   Title  Patient will score less than or equal to 20% on the Female NIH-CPSI, 15% on the PDI, and 15% on the VQ to demonstrate a reduction in pain, urinary symptoms, and an improved quality of life.    Baseline  VQ: 11/33 (33%),  PDI: 24/70 (34%), Female NIH-CPSI: 28/43 (65%)    Time  10    Period  Weeks    Status  New    Target Date  02/09/19      PT LONG TERM GOAL  #2   Title  Patient will report no pain with intercourse to demonstrate improved functional ability.    Baseline  pain with both penetration and deeper thrusting at certain angles, worse around her cycle.    Time  10    Period  Weeks    Status  New    Target Date  02/09/19  PT LONG TERM GOAL #3   Title  Patient will report urinating 6-8 times per day over the course of the prior week to demonstrate decreased frequency.    Baseline  Pt. urinating every hour or more often, Nocturiax3    Time  10    Period  Weeks    Status  New    Target Date  02/09/19            Plan - 01/25/19 [id number]    Clinical Impression Statement  Pt. Responded well to all interventions today, demonstrating improved gluteal engagement, decreased fascial tension along RLQ as well as understanding and correct performance of all education and exercises provided today. They will continue to benefit from skilled physical therapy to work toward remaining goals and maximize function as well as decrease likelihood of symptom increase or recurrence.    Personal Factors and Comorbidities  Comorbidity 3+    Comorbidities  Pelvic adhesions, GERD, h/o fibroids, chronic HA, R otosclerosis.    Examination-Activity Limitations  Transfers;Toileting;Stand;Bend;Carry;Sit;Locomotion Level;Dressing;Squat    Examination-Participation Restrictions  Cleaning;Meal Prep;Community Activity;Interpersonal Relationship;Laundry    Stability/Clinical Decision Making  Unstable/Unpredictable    Rehab Potential  Good    Clinical Impairments Affecting Rehab Potential  ovarian cysts, largely untreated chronic pain since 1996    PT Frequency  1x / week    PT Duration  Other (comment)    PT Treatment/Interventions  ADLs/Self Care Home Management;Biofeedback;Aquatic Therapy;Electrical Stimulation;Traction;Moist Heat;Functional mobility training;Neuromuscular re-education;Therapeutic exercise;Therapeutic activities;Patient/family education;Manual  techniques;Dry needling;Scar mobilization;Passive range of motion;Taping;Ultrasound;Joint Manipulations;Spinal Manipulations    PT Next Visit Plan  thoracolumbar mobility (side-lying rotational); self myofascial releaes techniques; progress MFR, joint mobility and assess pelvic tilt stability exercise    PT Home Exercise Plan  Squatty potty, and diaphragmatic breathing, side-stretch + rotation, hip-flexor hold-relax stretch (given modification 12-14-18). Heel lift given 12-14-18; taken out broomstick and self sacral mobs    Consulted and Agree with Plan of Care  Patient       Patient will benefit from skilled therapeutic intervention in order to improve the following deficits and impairments:  Increased fascial restricitons, Improper body mechanics, Pain, Decreased coordination, Decreased scar mobility, Increased muscle spasms, Impaired tone, Postural dysfunction, Decreased activity tolerance, Decreased range of motion, Decreased strength, Hypomobility, Difficulty walking, Impaired flexibility, Impaired vision/preception  Visit Diagnosis: Other muscle spasm  Abnormal posture  Muscular imbalance  Myalgia  Other lack of coordination     Problem List Patient Active Problem List   Diagnosis Date Noted  . Peritoneal adhesions    Andrea Baird, SPT  Andrea Baird 01/25/2019, 9:48 AM  [facility] [address] [place], [place], [address] Phone: [phone]   Fax:  [phone]  Name: Andrea Baird MRN: [id number] Date of Birth: [birth year]

## 2019-02-01 ENCOUNTER — Ambulatory Visit: Payer: BLUE CROSS/BLUE SHIELD

## 2019-02-01 ENCOUNTER — Other Ambulatory Visit: Payer: Self-pay

## 2019-02-01 DIAGNOSIS — M62838 Other muscle spasm: Secondary | ICD-10-CM

## 2019-02-01 DIAGNOSIS — M629 Disorder of muscle, unspecified: Secondary | ICD-10-CM

## 2019-02-01 DIAGNOSIS — M6289 Other specified disorders of muscle: Secondary | ICD-10-CM

## 2019-02-01 DIAGNOSIS — R293 Abnormal posture: Secondary | ICD-10-CM

## 2019-02-01 NOTE — Patient Instructions (Signed)
   Let the top arm rest on your side, and slide along the torso as you rotate. Breathe in as you come forward, out as you open up. Do 2x10 on each side.     Tuck your hips under, then keep the tuck as you lean forward so you feel a stretch through your low back. Hold for 5 deep breaths, repeat 2-3 times, 1-2 times per day.    Bring feet together and let the knees fall out to the sides. Hold for 5 deep breaths, rest then repeat 2 more times.

## 2019-02-01 NOTE — Therapy (Signed)
[facility] MAIN [facility] SERVICES [address] [place], [place], [address] Phone: [phone]   Fax:  [phone]  Physical Therapy Treatment  The patient has been informed of current processes in place at Outpatient Rehab to protect patients from Covid-19 exposure including social distancing, schedule modifications, and new cleaning procedures. After discussing their particular risk with a therapist based on the patient's personal risk factors, the patient has decided to proceed with in-person therapy.   Patient Details  Name: Andrea Baird MRN: [id number] Date of Birth: 04/11/1970 Referring Provider (PT): Heloise Ochoa   Encounter Date: 02/01/2019  PT End of Session - 02/01/19 0948    Visit Number  8    Number of Visits  10    Date for PT Re-Evaluation  02/09/19    Authorization Type  BCBS    Authorization Time Period  through 02/09/2019    Authorization - Visit Number  8    Authorization - Number of Visits  10    PT Start Time  0841    PT Stop Time  0940    PT Time Calculation (min)  59 min    Activity Tolerance  Patient tolerated treatment well;No increased pain    Behavior During Therapy  WFL for tasks assessed/performed       Past Medical History:  Diagnosis Date  . Anemia   . Chronic headaches 2018  . Elevated cholesterol   . GERD (gastroesophageal reflux disease)   . Otosclerosis, right 2019    Past Surgical History:  Procedure Laterality Date  . ABDOMINAL HYSTERECTOMY    . COLONOSCOPY, ESOPHAGOGASTRODUODENOSCOPY (EGD) AND ESOPHAGEAL DILATION    . ESOPHAGOGASTRODUODENOSCOPY (EGD) WITH PROPOFOL N/A 05/05/2017   Procedure: ESOPHAGOGASTRODUODENOSCOPY (EGD) WITH PROPOFOL;  Surgeon: Toledo, Boykin Nearing, MD;  Location: [facility] ENDOSCOPY;  Service: Gastroenterology;  Laterality: N/A;  . HYSTERECTOMY ABDOMINAL WITH SALPINGECTOMY Bilateral 11/2015  . LAPAROSCOPIC ABDOMINAL EXPLORATION Bilateral 1990  . LAPAROTOMY    . laproscopy    .  LYSIS OF ADHESION N/A 06/12/2017   Procedure: LYSIS OF ADHESION;  Surgeon: Christeen Douglas, MD;  Location: [facility] ORS;  Service: Gynecology;  Laterality: N/A;  . TUBAL LIGATION      There were no vitals filed for this visit.   Pelvic Floor Physical Therapy Treatment Note  SCREENING  Changes in medications, allergies, or medical history?: none    SUBJECTIVE  Patient reports: Is doing well, her pain was a high of 5 this morning, low of 2 now. Exercises are hard but just create muscular soreness, switched to every-other day to allow for recovery. Flares are not as bad as they used to be. Flare lasted ~ 24 hrs, no known origin.   Precautions:  none  Pain update:  Location of pain: RLQ Current pain:  2/10  Max pain:  8/10 (flare) Least pain:  2/10 Nature of pain: achy/dull  2/10 on NPS at end of session- at low R groin/SIJ.    Patient Goals: Get her pain not to flare up so bad, would love to be pain free.   OBJECTIVE  Changes in: Posture/Observations:  (from previous note not re-assessed today) L up-slip- ~90% corrected with manual therapy.   ROM/Mobility:  Decreased spinal mobility from T-L junction up through mid-thoracic spine.  R rot ~ 75% of motion with slight pain at end range. L rot ~ 50% of motion with increased spasm and pain into R QL and inner thigh.   Abdominal:  Decreased fascial mobility through RLQ.  Decreased fascial mobility through R adductors, Most restriction noted at R groin myofascially near inguinal ligament (referred pulling sensation to R calf).   Palpation: TTP to L QL    INTERVENTIONS THIS SESSION: Manual: R QL TP release to decrease pain brought on with release of pressure on the spine during spinal mobility. Myofascial release along RLQ, to decrease tension on pelvic nerves for decreased pain. Myofascial release near inguinal ligament/outer R thigh. R pectineus TP release to to decrease spasm and pain and allow for improved balance of  musculature for improved function and decreased symptoms.  Therex:Educated on and practiced seated low back stretch, butterfly stretch, and side-lying bow and arrow rotations to improve spinal mobility and To maintain and improve muscle length and allow for improved balance of musculature for long-term symptom relief.  Total time: 60 min.                              PT Short Term Goals - 12/01/18 1617      PT SHORT TERM GOAL #1   Title  Patient will demonstrate a coordinated contraction, relaxation, and bulge of the pelvic floor muscles to demonstrate functional recruitment and motion and allow for further strengthening.    Baseline  Pt. Report and prior exam suggests PFM spasms and decreased ROM and coordination.    Time  5    Period  Weeks    Status  New    Target Date  01/05/19      PT SHORT TERM GOAL #2   Title  Patient will demonstrate HEP x1 in the clinic to demonstrate understanding and proper form to allow for further improvement.    Time  5    Period  Weeks    Status  New    Target Date  01/05/19      PT SHORT TERM GOAL #3   Title  Patient will demonstrate improved pelvic allignment in standing to allow for improved muscular balance and to allow relaxation of the PFM for decreased pain with intercourse.    Baseline  R up-slip    Time  5    Period  Weeks    Status  New    Target Date  01/05/19      PT SHORT TERM GOAL #4   Title  Pt. will demonstrate consistent self MFR to lower abdomen and posterior fourchette to allow for scar lengthening and decreased tension on nerves.    Baseline  Pt. not performing MFR    Time  5    Period  Weeks    Status  New    Target Date  01/05/19        PT Long Term Goals - 12/01/18 1620      PT LONG TERM GOAL #1   Title  Patient will score less than or equal to 20% on the Female NIH-CPSI, 15% on the [place], and 15% on the VQ to demonstrate a reduction in pain, urinary symptoms, and an improved quality of life.     Baseline  VQ: 11/33 (33%),  PDI: 24/70 (34%), Female NIH-CPSI: 28/43 (65%)    Time  10    Period  Weeks    Status  New    Target Date  02/09/19      PT LONG TERM GOAL #2   Title  Patient will report no pain with intercourse to demonstrate improved functional ability.    Baseline  pain with  both penetration and deeper thrusting at certain angles, worse around her cycle.    Time  10    Period  Weeks    Status  New    Target Date  02/09/19      PT LONG TERM GOAL #3   Title  Patient will report urinating 6-8 times per day over the course of the prior week to demonstrate decreased frequency.    Baseline  Pt. urinating every hour or more often, Nocturiax3    Time  10    Period  Weeks    Status  New    Target Date  02/09/19            Plan - 02/01/19 0948    Clinical Impression Statement  Pt. Responded well to all interventions today, demonstrating improved ROM into rotation, decreased spasm and no increase of pain as well as understanding and correct performance of all education and exercises provided today. They will continue to benefit from skilled physical therapy to work toward remaining goals and maximize function as well as decrease likelihood of symptom increase or recurrence.     Personal Factors and Comorbidities  Comorbidity 3+    Comorbidities  Pelvic adhesions, GERD, h/o fibroids, chronic HA, R otosclerosis.    Examination-Activity Limitations  Transfers;Toileting;Stand;Bend;Carry;Sit;Locomotion Level;Dressing;Squat    Examination-Participation Restrictions  Cleaning;Meal Prep;Community Activity;Interpersonal Relationship;Laundry    Stability/Clinical Decision Making  Unstable/Unpredictable    Rehab Potential  Good    Clinical Impairments Affecting Rehab Potential  ovarian cysts, largely untreated chronic pain since 1996    PT Frequency  1x / week    PT Duration  Other (comment)    PT Treatment/Interventions  ADLs/Self Care Home Management;Biofeedback;Aquatic  Therapy;Electrical Stimulation;Traction;Moist Heat;Functional mobility training;Neuromuscular re-education;Therapeutic exercise;Therapeutic activities;Patient/family education;Manual techniques;Dry needling;Scar mobilization;Passive range of motion;Taping;Ultrasound;Joint Manipulations;Spinal Manipulations    PT Next Visit Plan  joint mobility and assess pelvic tilt stability exercise, assess internally vs. further abdominal/MFR, assess gait?    PT Home Exercise Plan  Squatty potty, and diaphragmatic breathing, side-stretch + rotation, hip-flexor hold-relax stretch (given modification 12-14-18). Heel lift given 12-14-18; taken out broomstick and self sacral mobs; counter top planks; bridges; self myofascial releaes techniques;    Consulted and Agree with Plan of Care  Patient       Patient will benefit from skilled therapeutic intervention in order to improve the following deficits and impairments:  Increased fascial restricitons, Improper body mechanics, Pain, Decreased coordination, Decreased scar mobility, Increased muscle spasms, Impaired tone, Postural dysfunction, Decreased activity tolerance, Decreased range of motion, Decreased strength, Hypomobility, Difficulty walking, Impaired flexibility, Impaired vision/preception  Visit Diagnosis: Other muscle spasm  Abnormal posture  Muscular imbalance     Problem List Patient Active Problem List   Diagnosis Date Noted  . Peritoneal adhesions    Cleophus MoltKeeli T. Neelie Welshans DPT, ATC Cleophus MoltKeeli T Emberlyn Burlison 02/01/2019, 10:02 AM  [facility] [address] [place], [place], [address] Phone: [phone]   Fax:  [phone]  Name: Andrea Baird MRN: [id number] Date of Birth: 28-Oct-1970

## 2019-02-08 ENCOUNTER — Ambulatory Visit: Payer: BLUE CROSS/BLUE SHIELD | Attending: Obstetrics & Gynecology

## 2019-02-08 ENCOUNTER — Other Ambulatory Visit: Payer: Self-pay

## 2019-02-08 DIAGNOSIS — M629 Disorder of muscle, unspecified: Secondary | ICD-10-CM | POA: Diagnosis present

## 2019-02-08 DIAGNOSIS — M62838 Other muscle spasm: Secondary | ICD-10-CM | POA: Insufficient documentation

## 2019-02-08 DIAGNOSIS — R293 Abnormal posture: Secondary | ICD-10-CM | POA: Insufficient documentation

## 2019-02-08 DIAGNOSIS — M6289 Other specified disorders of muscle: Secondary | ICD-10-CM

## 2019-02-08 NOTE — Therapy (Signed)
[name] [facility] SERVICES [address] [place], [place], [address] Phone: [phone]   Fax:  [phone]  Physical Therapy Treatment  The patient has been informed of current processes in place at Outpatient Rehab to protect patients from Covid-19 exposure including social distancing, schedule modifications, and new cleaning procedures. After discussing their particular risk with a therapist based on the patient's personal risk factors, the patient has decided to proceed with in-person therapy.   Patient Details  Name: Andrea Baird Baird MRN: [id number] Date of Birth: 10/30/1970 Referring Provider (PT): Hassan Buckler   Encounter Date: 02/08/2019  PT End of Session - 02/08/19 0937    Visit Number  9    Number of Visits  10    Date for PT Re-Evaluation  02/09/19    Authorization Type  BCBS    Authorization Time Period  through 02/09/2019    Authorization - Visit Number  9    Authorization - Number of Visits  10    PT Start Time  0835    PT Stop Time  0935    PT Time Calculation (min)  60 min    Activity Tolerance  Patient tolerated treatment well    Behavior During Therapy  [facility] for tasks assessed/performed       Past Medical History:  Diagnosis Date  . Anemia   . Chronic headaches 2018  . Elevated cholesterol   . GERD (gastroesophageal reflux disease)   . Otosclerosis, right 2019    Past Surgical History:  Procedure Laterality Date  . ABDOMINAL HYSTERECTOMY    . COLONOSCOPY, ESOPHAGOGASTRODUODENOSCOPY (EGD) AND ESOPHAGEAL DILATION    . ESOPHAGOGASTRODUODENOSCOPY (EGD) WITH PROPOFOL N/A 05/05/2017   Procedure: ESOPHAGOGASTRODUODENOSCOPY (EGD) WITH PROPOFOL;  Surgeon: Toledo, Benay Pike, MD;  Location: [facility] ENDOSCOPY;  Service: Gastroenterology;  Laterality: N/A;  . HYSTERECTOMY ABDOMINAL WITH SALPINGECTOMY Bilateral 11/2015  . LAPAROSCOPIC ABDOMINAL EXPLORATION Bilateral 1990  . LAPAROTOMY    . laproscopy    . LYSIS OF ADHESION  N/A 06/12/2017   Procedure: LYSIS OF ADHESION;  Surgeon: Benjaman Kindler, MD;  Location: [facility] ORS;  Service: Gynecology;  Laterality: N/A;  . TUBAL LIGATION      There were no vitals filed for this visit.  Pelvic Floor Physical Therapy Treatment Note  SCREENING  Changes in medications, allergies, or medical history?: none    SUBJECTIVE  Patient reports: Is doing "OK" Pain got up to 8/10 on Wednesday and Thursday but flares still do not last as long. There was a delayed reaction following PT last time. Exercises going well, planks and bridges are getting easier but still challenging.   Precautions:  none  Pain update:  Location of pain: RLQ Current pain:  3/10  Max pain:  8/10 (flare) Least pain:  2/10 Nature of pain: achy/dull  4/10 on NPS at end of session- at low R groin/SIJ.    Patient Goals: Get her pain not to flare up so bad, would love to be pain free.   OBJECTIVE  Changes in: Posture/Observations:  (from previous note not re-assessed today) L up-slip- ~90% corrected with manual therapy.   ROM/Mobility:  (from previous note not re-assessed today)Decreased spinal mobility from T-L junction up through mid-thoracic spine. R rot ~ 75% of motion with slight pain at end range. L rot ~ 50% of motion with increased spasm and pain into R QL and inner thigh.   Pelvic Floor External Exam: Introitus Appears: WNL Skin integrity: WNL Palpation: no TTP Cough: adductor  activation, little PFM engagement Prolapse visible?: no Scar mobility: scar tissue at ~ 7:30 that re-creates pain with intercourse  Internal Vaginal Exam: Strength (PERF): 3/5 Symmetry: decreased firing of anterior fibers>posterior Palpation: TTP throughout with fascial restriction anteriorly B  Prolapse: mild anterior wall visible ~ 1 cm above introitus.   Abdominal:  (from previous note not re-assessed today) Decreased fascial mobility through RLQ. Decreased fascial mobility through R  adductors, Most restriction noted at R groin myofascially near inguinal ligament (referred pulling sensation to R calf).   Palpation: TTP to R Pectineus that referred to R groin, decreased following treatment.   INTERVENTIONS THIS SESSION: Manual: TP release to R Pectineus to decrease spasm and pain and allow for improved balance of musculature for improved function and decreased symptoms. Performed internal PFM assessment to further inform POC as well as MFR B to restriction near anterior PR/PC to decrease tension on nerves and overall pain. Performed TP release to L PR/PC posteriorly and scar release to ~7:30 at posterior fourchette to decrease spasm and pain and educated Pt. On how to continue with self posterior fourchette release at home.   Total time: 60 min.                              PT Short Term Goals - 12/01/18 1617      PT SHORT TERM GOAL #1   Title  Patient will demonstrate a coordinated contraction, relaxation, and bulge of the pelvic floor muscles to demonstrate functional recruitment and motion and allow for further strengthening.    Baseline  Pt. Report and prior exam suggests PFM spasms and decreased ROM and coordination.    Time  5    Period  Weeks    Status  New    Target Date  01/05/19      PT SHORT TERM GOAL #2   Title  Patient will demonstrate HEP x1 in the clinic to demonstrate understanding and proper form to allow for further improvement.    Time  5    Period  Weeks    Status  New    Target Date  01/05/19      PT SHORT TERM GOAL #3   Title  Patient will demonstrate improved pelvic allignment in standing to allow for improved muscular balance and to allow relaxation of the PFM for decreased pain with intercourse.    Baseline  R up-slip    Time  5    Period  Weeks    Status  New    Target Date  01/05/19      PT SHORT TERM GOAL #4   Title  Pt. will demonstrate consistent self MFR to lower abdomen and posterior fourchette to  allow for scar lengthening and decreased tension on nerves.    Baseline  Pt. not performing MFR    Time  5    Period  Weeks    Status  New    Target Date  01/05/19        PT Long Term Goals - 12/01/18 1620      PT LONG TERM GOAL #1   Title  Patient will score less than or equal to 20% on the Female NIH-CPSI, 15% on the PDI, and 15% on the VQ to demonstrate a reduction in pain, urinary symptoms, and an improved quality of life.    Baseline  VQ: 11/33 (33%),  PDI: 24/70 (34%), Female NIH-CPSI: 28/43 (65%)  Time  10    Period  Weeks    Status  New    Target Date  02/09/19      PT LONG TERM GOAL #2   Title  Patient will report no pain with intercourse to demonstrate improved functional ability.    Baseline  pain with both penetration and deeper thrusting at certain angles, worse around her cycle.    Time  10    Period  Weeks    Status  New    Target Date  02/09/19      PT LONG TERM GOAL #3   Title  Patient will report urinating 6-8 times per day over the course of the prior week to demonstrate decreased frequency.    Baseline  Pt. urinating every hour or more often, Nocturiax3    Time  10    Period  Weeks    Status  New    Target Date  02/09/19            Plan - 02/08/19 0929    Clinical Impression Statement  Pt. Responded well to all interventions today, demonstrating decreased spasm and increased scar length with mild irritation of symptoms due to irritability of myofascial tissue as well as understanding and correct performance of all education and exercises provided today. They will continue to benefit from skilled physical therapy to work toward remaining goals and maximize function as well as decrease likelihood of symptom increase or recurrence.     Personal Factors and Comorbidities  Comorbidity 3+    Comorbidities  Pelvic adhesions, GERD, h/o fibroids, chronic HA, R otosclerosis.    Examination-Activity Limitations   Transfers;Toileting;Stand;Bend;Carry;Sit;Locomotion Level;Dressing;Squat    Examination-Participation Restrictions  Cleaning;Meal Prep;Community Activity;Interpersonal Relationship;Laundry    Stability/Clinical Decision Making  Unstable/Unpredictable    Rehab Potential  Good    Clinical Impairments Affecting Rehab Potential  ovarian cysts, largely untreated chronic pain since 1996    PT Frequency  1x / week    PT Duration  Other (comment)    PT Treatment/Interventions  ADLs/Self Care Home Management;Biofeedback;Aquatic Therapy;Electrical Stimulation;Traction;Moist Heat;Functional mobility training;Neuromuscular re-education;Therapeutic exercise;Therapeutic activities;Patient/family education;Manual techniques;Dry needling;Scar mobilization;Passive range of motion;Taping;Ultrasound;Joint Manipulations;Spinal Manipulations    PT Next Visit Plan  Internal TP release vs. educate on self release vs. further abdominal/MFR, assess gait?    PT Home Exercise Plan  Squatty potty, and diaphragmatic breathing, side-stretch + rotation, hip-flexor hold-relax stretch (given modification 12-14-18). Heel lift given 12-14-18; taken out broomstick and self sacral mobs; counter top planks; bridges; self myofascial releaes techniques; Bow and arrow, self posterior fourchette    Consulted and Agree with Plan of Care  Patient       Patient will benefit from skilled therapeutic intervention in order to improve the following deficits and impairments:  Increased fascial restricitons, Improper body mechanics, Pain, Decreased coordination, Decreased scar mobility, Increased muscle spasms, Impaired tone, Postural dysfunction, Decreased activity tolerance, Decreased range of motion, Decreased strength, Hypomobility, Difficulty walking, Impaired flexibility, Impaired vision/preception  Visit Diagnosis: Other muscle spasm  Abnormal posture  Muscular imbalance     Problem List Patient Active Problem List   Diagnosis Date  Noted  . Peritoneal adhesions    Cleophus MoltKeeli T. Gailes DPT, ATC Cleophus MoltKeeli T Gailes 02/08/2019, 9:57 AM  [place] [facility] [address] [place], [place], [id number] Phone: [phone]   Fax:  [phone]  Name: Andrea Baird Baird MRN: [id number] Date of Birth: 12-29-1970

## 2019-02-08 NOTE — Patient Instructions (Signed)
Self Internal Trigger Point Relief    1) Wash your hands and prop yourself up in a way where you can easily reach the vagina. You may wish to have a small hand-held mirror near by.  2) lubricate the tool and vaginal opening using a hypoallergenic lubricant such as "slippery-stuff".   3) Slowly and gently insert the tool into the vagina using deep breaths to allow relaxation of the muscles around the tool.  4) Avoiding the "12 o-clock" region near the urethra, gently use the handle of the tool like a lever to press the angled tip of the tool onto the wall of the pelvic floor.   5) Move the tool to different areas of the pelvic floor and feel for areas that are tender called "trigger points". When you find one hold the tool still, applying just enough pressure to elicit mild discomfort and take deep belly breaths until the discomfort subsides or decreases by at least 50%.   6) Repeat the process for any trigger points you find spending between 3-10 minutes on this per night until you do not find any more trigger points or you are told otherwise by your therapist..  Self Posterior Fourchette Stretching/Mobilization    1) Wash your hands and prop your body up so you can easily reach the vagina, bring hand-held mirror if desired.  2) Apply lubricant to the thumb and vaginal opening  3) Place thumb ~ 1/2 an inch into the vagina with the pad of the thumb pointed down and apply gentle pressure to the posterior fourchette.  4) Gently sweep the thumb side to side and in/out while maintaining pressure down toward the anus. Make sure the pressure is not so great that your muscles tighten up and guard, just enough to create slight discomfort.  Do this for ~ 3 min. Per night to decrease tightness and tenderness at the vaginal opening.   

## 2019-02-15 ENCOUNTER — Other Ambulatory Visit: Payer: Self-pay

## 2019-02-15 ENCOUNTER — Ambulatory Visit: Payer: BLUE CROSS/BLUE SHIELD

## 2019-02-15 DIAGNOSIS — R293 Abnormal posture: Secondary | ICD-10-CM

## 2019-02-15 DIAGNOSIS — M629 Disorder of muscle, unspecified: Secondary | ICD-10-CM

## 2019-02-15 DIAGNOSIS — M6289 Other specified disorders of muscle: Secondary | ICD-10-CM

## 2019-02-15 DIAGNOSIS — M62838 Other muscle spasm: Secondary | ICD-10-CM | POA: Diagnosis not present

## 2019-02-15 NOTE — Therapy (Addendum)
[place] [facility] MAIN [facility] SERVICES [address] [place], [place], [address] Phone: [phone]   Fax:  [phone]  Physical Therapy Treatment The patient has been informed of current processes in place at Outpatient Rehab to protect patients from Covid-19 exposure including social distancing, schedule modifications, and new cleaning procedures. After discussing their particular risk with a therapist based on the patient's personal risk factors, the patient has decided to proceed with in-person therapy.   Patient Details  Name: Andrea Baird MRN: [id number] Date of Birth: May 16, 1970 Referring Provider (PT): Heloise Ochoa   Encounter Date: 02/15/2019  PT End of Session - 02/15/19 1232    Visit Number  10    Number of Visits  10    Date for PT Re-Evaluation  02/09/19    Authorization Type  BCBS    Authorization Time Period  through 02/09/2019    Authorization - Visit Number  10    Authorization - Number of Visits  10    PT Start Time  1115    PT Stop Time  1220    PT Time Calculation (min)  65 min    Activity Tolerance  Patient tolerated treatment well    Behavior During Therapy  [facility] for tasks assessed/performed       Past Medical History:  Diagnosis Date  . Anemia   . Chronic headaches 2018  . Elevated cholesterol   . GERD (gastroesophageal reflux disease)   . Otosclerosis, right 2019    Past Surgical History:  Procedure Laterality Date  . ABDOMINAL HYSTERECTOMY    . COLONOSCOPY, ESOPHAGOGASTRODUODENOSCOPY (EGD) AND ESOPHAGEAL DILATION    . ESOPHAGOGASTRODUODENOSCOPY (EGD) WITH PROPOFOL N/A 05/05/2017   Procedure: ESOPHAGOGASTRODUODENOSCOPY (EGD) WITH PROPOFOL;  Surgeon: Toledo, Boykin Nearing, MD;  Location: [facility] ENDOSCOPY;  Service: Gastroenterology;  Laterality: N/A;  . HYSTERECTOMY ABDOMINAL WITH SALPINGECTOMY Bilateral 11/2015  . LAPAROSCOPIC ABDOMINAL EXPLORATION Bilateral 1990  . LAPAROTOMY    . laproscopy    . LYSIS OF ADHESION  N/A 06/12/2017   Procedure: LYSIS OF ADHESION;  Surgeon: Christeen Douglas, MD;  Location: [facility] ORS;  Service: Gynecology;  Laterality: N/A;  . TUBAL LIGATION      There were no vitals filed for this visit.  Pelvic Floor Physical Therapy Treatment Note  SCREENING  Changes in medications, allergies, or medical history?: none    SUBJECTIVE  Patient reports: Has had increased pain since last visit. Has been unable to get pain to decrease her pain using warm baths or other go-to fixes.    Precautions:  none  Pain update:  Location of pain: RLQ Current pain:  7/10  Max pain:  9/10 (flare) Least pain:  4/10 Nature of pain: achy/dull  8/10 on NPS at end of session- at low R groin/SIJ.    Patient Goals: Get her pain not to flare up so bad, would love to be pain free.   OBJECTIVE  Changes in: Posture/Observations:  (from previous note not re-assessed today) L up-slip- ~90% corrected with manual therapy.   ROM/Mobility:  (from previous note not re-assessed today)Decreased spinal mobility from T-L junction up through mid-thoracic spine. R rot ~ 75% of motion with slight pain at end range. L rot ~ 50% of motion with increased spasm and pain into R QL and inner thigh.   Pelvic Floor TTP to R PR, OI internally (radiated mostly to the L hip with palpation)  Abdominal:  (from previous note not re-assessed today) Decreased fascial mobility through RLQ. Decreased fascial  mobility through R adductors, Most restriction noted at R groin myofascially near inguinal ligament (referred pulling sensation to R calf).   Palpation:   INTERVENTIONS THIS SESSION: Manual: Performed TP release to R PR/PC posteriorly while Pt. Using MHP on lower abdomen for pain control and relaxation to decrease spasm and pain and allow for improved balance of musculature for improved function and decreased symptoms.  Self-care: Educated on why her PFM are recreating her hip pain and how the scar tissue  pulls from one to the other, why she has referred and radiating pain with palpation.  Therex: Educated on and practiced modified child's pose to gently lengthen through the PFM to maintain release this session and improve muscle length and allow for improved balance of musculature for long-term symptom relief.    Total time: 65 min.                             PT Short Term Goals - 02/15/19 1454      PT SHORT TERM GOAL #1   Title  Patient will demonstrate a coordinated contraction, relaxation, and bulge of the pelvic floor muscles to demonstrate functional recruitment and motion and allow for further strengthening.    Baseline  Pt. Report and prior exam suggests PFM spasms and decreased ROM and coordination.    Time  5    Period  Weeks    Status  Achieved    Target Date  01/05/19      PT SHORT TERM GOAL #2   Title  Patient will demonstrate HEP x1 in the clinic to demonstrate understanding and proper form to allow for further improvement.    Time  5    Period  Weeks    Status  Achieved    Target Date  01/05/19      PT SHORT TERM GOAL #3   Title  Patient will demonstrate improved pelvic allignment in standing to allow for improved muscular balance and to allow relaxation of the PFM for decreased pain with intercourse.    Baseline  R up-slip    Time  5    Period  Weeks    Status  Achieved    Target Date  01/05/19      PT SHORT TERM GOAL #4   Title  Pt. will demonstrate consistent self MFR to lower abdomen and posterior fourchette to allow for scar lengthening and decreased tension on nerves.    Baseline  Pt. not performing MFR    Time  5    Period  Weeks    Status  Achieved    Target Date  01/05/19        PT Long Term Goals - 02/15/19 1455      PT LONG TERM GOAL #1   Title  Patient will score less than or equal to 20% on the Female NIH-CPSI, 15% on the PDI, and 15% on the VQ to demonstrate a reduction in pain, urinary symptoms, and an improved  quality of life.    Baseline  VQ: 11/33 (33%),  PDI: 24/70 (34%), Female NIH-CPSI: 28/43 (65%)    Time  10    Period  Weeks    Status  On-going    Target Date  04/26/19      PT LONG TERM GOAL #2   Title  Patient will report no pain with intercourse to demonstrate improved functional ability.    Baseline  pain with both penetration and  deeper thrusting at certain angles, worse around her cycle.    Time  10    Period  Weeks    Status  On-going    Target Date  04/26/19      PT LONG TERM GOAL #3   Title  Patient will report urinating 6-8 times per day over the course of the prior week to demonstrate decreased frequency.    Baseline  Pt. urinating every hour or more often, Nocturiax3    Time  10    Period  Weeks    Status  New    Target Date  04/26/19            Plan - 02/15/19 1503    Clinical Impression Statement  Pt had been improving in overall pain through visit number 8 but has had increased pain since we have begun internal assessment and treatment of the PFM, likely due to significant scar tissue in the pelvis and resultant spasms that are highly irritable. It is too early to tell if Pt. will demonstrate improvement once more scar tissue and PFM spasms have been improved. She had increased pain following TP release to L PFM at previous visit and did not have reduction of pain during this visit, with additional pain in the L hip as well as the R following treatment. She did demonstrate decreased spasms and referred pain following trigger point release internally as well as understanding and correct performance of all education and exercises provided today. If pain continues to be high at next visit, modification to plan/more gentle techniques may be necessary. They will continue to benefit from skilled physical therapy to work toward remaining goals and maximize function as well as decrease likelihood of symptom increase or recurrence.     Personal Factors and Comorbidities   Comorbidity 3+    Comorbidities  Pelvic adhesions, GERD, h/o fibroids, chronic HA, R otosclerosis.    Examination-Activity Limitations  Transfers;Toileting;Stand;Bend;Carry;Sit;Locomotion Level;Dressing;Squat    Examination-Participation Restrictions  Cleaning;Meal Prep;Community Activity;Interpersonal Relationship;Laundry    Stability/Clinical Decision Making  Unstable/Unpredictable    Rehab Potential  Good    Clinical Impairments Affecting Rehab Potential  ovarian cysts, largely untreated chronic pain since 1996    PT Frequency  1x / week    PT Duration  Other (comment)    PT Treatment/Interventions  ADLs/Self Care Home Management;Biofeedback;Aquatic Therapy;Electrical Stimulation;Traction;Moist Heat;Functional mobility training;Neuromuscular re-education;Therapeutic exercise;Therapeutic activities;Patient/family education;Manual techniques;Dry needling;Scar mobilization;Passive range of motion;Taping;Ultrasound;Joint Manipulations;Spinal Manipulations    PT Next Visit Plan  Internal TP release vs. educate on self release vs. further abdominal/MFR, assess gait?    PT Home Exercise Plan  Squatty potty, and diaphragmatic breathing, side-stretch + rotation, hip-flexor hold-relax stretch (given modification 12-14-18). Heel lift given 12-14-18; taken out broomstick and self sacral mobs; counter top planks; bridges; self myofascial releaes techniques; Bow and arrow, self posterior fourchette    Consulted and Agree with Plan of Care  Patient       Patient will benefit from skilled therapeutic intervention in order to improve the following deficits and impairments:  Increased fascial restricitons, Improper body mechanics, Pain, Decreased coordination, Decreased scar mobility, Increased muscle spasms, Impaired tone, Postural dysfunction, Decreased activity tolerance, Decreased range of motion, Decreased strength, Hypomobility, Difficulty walking, Impaired flexibility, Impaired vision/preception  Visit  Diagnosis: Other muscle spasm  Abnormal posture  Muscular imbalance     Problem List Patient Active Problem List   Diagnosis Date Noted  . Peritoneal adhesions    Willa Rough DPT, ATC Willa Rough  02/15/2019, 3:20 PM  [facility] MAIN [facility] SERVICES [address] [place], [place], [address] Phone: [phone]   Fax:  [phone]  Name: Andrea Baird MRN: [id number] Date of Birth: [birth year]

## 2019-02-15 NOTE — Addendum Note (Signed)
Addended by: Letitia Libra T on: 02/15/2019 03:22 PM   Modules accepted: Orders

## 2019-02-15 NOTE — Patient Instructions (Signed)
Child's Pose Pelvic Floor Lengthening    Sit in knee-chest position and reach arms forward. Separate knees for comfort. Hold position for _5__ breaths. Repeat _2-3__ times. Do _1-2__ times per day.  *Take a pillow and place under thighs or stomach

## 2019-02-22 ENCOUNTER — Other Ambulatory Visit: Payer: Self-pay

## 2019-02-22 ENCOUNTER — Ambulatory Visit: Payer: BLUE CROSS/BLUE SHIELD

## 2019-02-22 DIAGNOSIS — M629 Disorder of muscle, unspecified: Secondary | ICD-10-CM

## 2019-02-22 DIAGNOSIS — M62838 Other muscle spasm: Secondary | ICD-10-CM

## 2019-02-22 DIAGNOSIS — R293 Abnormal posture: Secondary | ICD-10-CM

## 2019-02-22 DIAGNOSIS — M6289 Other specified disorders of muscle: Secondary | ICD-10-CM

## 2019-02-22 NOTE — Patient Instructions (Addendum)
Wear the TENS unit for at least 30 min. Per day. Feel free to wear it more throughout the day as desired, giving yourself at least 30 min. In-between to rest.   Set intensity to "strong tingle" but below discomfort. Place pads horizontally, just inside the hip bone on the front and over the "butt dimple" on the low back, use one channel on the left, one on the right.    Hold for 5 deep breaths, repeat 2-3 times, once daily.

## 2019-02-22 NOTE — Therapy (Signed)
[facility] MAIN [facility] SERVICES [address] [place], [place], [address] Phone: [phone]   Fax:  [phone]  Physical Therapy Treatment  The patient has been informed of current processes in place at Outpatient Rehab to protect patients from Covid-19 exposure including social distancing, schedule modifications, and new cleaning procedures. After discussing their particular risk with a therapist based on the patient's personal risk factors, the patient has decided to proceed with in-person therapy.   Patient Details  Name: Andrea BROZOWSKI MRN: [id number] Date of Birth: [birth year] Referring Provider (PT): Heloise Ochoa   Encounter Date: 02/22/2019  PT End of Session - 02/22/19 0949    Visit Number  1    Number of Visits  10    Date for PT Re-Evaluation  04/26/19    Authorization Type  BCBS    Authorization Time Period  through 02/09/2019    Authorization - Visit Number  10    Authorization - Number of Visits  10    PT Start Time  [phone]    PT Stop Time  0938    PT Time Calculation (min)  60 min    Activity Tolerance  Patient tolerated treatment well    Behavior During Therapy  [facility] for tasks assessed/performed       Past Medical History:  Diagnosis Date  . Anemia   . Chronic headaches 2018  . Elevated cholesterol   . GERD (gastroesophageal reflux disease)   . Otosclerosis, right 2019    Past Surgical History:  Procedure Laterality Date  . ABDOMINAL HYSTERECTOMY    . COLONOSCOPY, ESOPHAGOGASTRODUODENOSCOPY (EGD) AND ESOPHAGEAL DILATION    . ESOPHAGOGASTRODUODENOSCOPY (EGD) WITH PROPOFOL N/A 05/05/2017   Procedure: ESOPHAGOGASTRODUODENOSCOPY (EGD) WITH PROPOFOL;  Surgeon: Toledo, Boykin Nearing, MD;  Location: [facility] ENDOSCOPY;  Service: Gastroenterology;  Laterality: N/A;  . HYSTERECTOMY ABDOMINAL WITH SALPINGECTOMY Bilateral 11/2015  . LAPAROSCOPIC ABDOMINAL EXPLORATION Bilateral 1990  . LAPAROTOMY    . laproscopy    . LYSIS OF ADHESION  N/A 06/12/2017   Procedure: LYSIS OF ADHESION;  Surgeon: Christeen Douglas, MD;  Location: [facility] ORS;  Service: Gynecology;  Laterality: N/A;  . TUBAL LIGATION      There were no vitals filed for this visit.    Pelvic Floor Physical Therapy Treatment Note  SCREENING  Changes in medications, allergies, or medical history?: none    SUBJECTIVE  Patient reports: Has had very high pain since last week, By Saturday it had calmed down a little bit but it is still higher than it normally is.   Precautions:  none  Pain update:  Location of pain: RLQ Current pain:  6/10  Max pain:  10/10 (flare) Least pain:  4/10 Nature of pain: achy/dull  4/10 on NPS at end of session- at low R groin/SIJ.    Patient Goals: Get her pain not to flare up so bad, would love to be pain free.   OBJECTIVE  Changes in: Posture/Observations:  C1 transverse process hypersensitized, C2 spinous process L deviated  ROM/Mobility:  (from previous note not re-assessed today)Decreased spinal mobility from T-L junction up through mid-thoracic spine. R rot ~ 75% of motion with slight pain at end range. L rot ~ 50% of motion with increased spasm and pain into R QL and inner thigh.   Pelvic Floor TTP to R PR, OI internally (radiated mostly to the L hip with palpation) (from previous session)  Abdominal:  (from previous note not re-assessed today) Decreased fascial mobility through RLQ.  Decreased fascial mobility through R adductors, Most restriction noted at R groin myofascially near inguinal ligament (referred pulling sensation to R calf).   Palpation: TTP to L>R cervical extensors and SCM. Hypersensitization of C1 transverse process region   INTERVENTIONS THIS SESSION: Manual: Performed TP release L SCM and superficial cervical extensors as well as grade 2-3 L to R lateral mobs to C2 spinous process to decrease pain and spasm and help decrease overall sensitivity of nervous system.  E-stim: Placed  TENS unit on B PSIS and medial to ASIS with 4 inch oval pads. Increased intensity to strong tingle. Used with simultaneous MHP to decrease pain and sensitivity from flare up and interrupt the pain cycle to down-regulate pain cycle.  Therex: Educated on Happy baby stretch to replace child's pose due to Pt. Report of increased LBP with performance of HEP and allow for decreased tension within and acting upon the pelvis for decreased pain and PFM spasms.    Total time: 60 min.                              PT Short Term Goals - 02/15/19 1454      PT SHORT TERM GOAL #1   Title  Patient will demonstrate a coordinated contraction, relaxation, and bulge of the pelvic floor muscles to demonstrate functional recruitment and motion and allow for further strengthening.    Baseline  Pt. Report and prior exam suggests PFM spasms and decreased ROM and coordination.    Time  5    Period  Weeks    Status  Achieved    Target Date  01/05/19      PT SHORT TERM GOAL #2   Title  Patient will demonstrate HEP x1 in the clinic to demonstrate understanding and proper form to allow for further improvement.    Time  5    Period  Weeks    Status  Achieved    Target Date  01/05/19      PT SHORT TERM GOAL #3   Title  Patient will demonstrate improved pelvic allignment in standing to allow for improved muscular balance and to allow relaxation of the PFM for decreased pain with intercourse.    Baseline  R up-slip    Time  5    Period  Weeks    Status  Achieved    Target Date  01/05/19      PT SHORT TERM GOAL #4   Title  Pt. will demonstrate consistent self MFR to lower abdomen and posterior fourchette to allow for scar lengthening and decreased tension on nerves.    Baseline  Pt. not performing MFR    Time  5    Period  Weeks    Status  Achieved    Target Date  01/05/19        PT Long Term Goals - 02/15/19 1455      PT LONG TERM GOAL #1   Title  Patient will score less than  or equal to 20% on the Female NIH-CPSI, 15% on the PDI, and 15% on the VQ to demonstrate a reduction in pain, urinary symptoms, and an improved quality of life.    Baseline  VQ: 11/33 (33%),  PDI: 24/70 (34%), Female NIH-CPSI: 28/43 (65%)    Time  10    Period  Weeks    Status  On-going    Target Date  04/26/19      PT  LONG TERM GOAL #2   Title  Patient will report no pain with intercourse to demonstrate improved functional ability.    Baseline  pain with both penetration and deeper thrusting at certain angles, worse around her cycle.    Time  10    Period  Weeks    Status  On-going    Target Date  04/26/19      PT LONG TERM GOAL #3   Title  Patient will report urinating 6-8 times per day over the course of the prior week to demonstrate decreased frequency.    Baseline  Pt. urinating every hour or more often, Nocturiax3    Time  10    Period  Weeks    Status  On-going    Target Date  04/26/19            Plan - 02/22/19 0956    Clinical Impression Statement  Pt. Responded well to all interventions today, demonstrating improved pain following flare-up, from 6/10 to 4/10 as well as improved C2 mobility and decreased spasms through L cervical extensors by ~ 30% as well as understanding and correct performance of all education and exercises provided today. They will continue to benefit from skilled physical therapy to work toward remaining goals and maximize function as well as decrease likelihood of symptom increase or recurrence.     Personal Factors and Comorbidities  Comorbidity 3+    Comorbidities  Pelvic adhesions, GERD, h/o fibroids, chronic HA, R otosclerosis.    Examination-Activity Limitations  Transfers;Toileting;Stand;Bend;Carry;Sit;Locomotion Level;Dressing;Squat    Examination-Participation Restrictions  Cleaning;Meal Prep;Community Activity;Interpersonal Relationship;Laundry    Stability/Clinical Decision Making  Unstable/Unpredictable    Rehab Potential  Good     Clinical Impairments Affecting Rehab Potential  ovarian cysts, largely untreated chronic pain since 1996    PT Frequency  1x / week    PT Duration  Other (comment)    PT Treatment/Interventions  ADLs/Self Care Home Management;Biofeedback;Aquatic Therapy;Electrical Stimulation;Traction;Moist Heat;Functional mobility training;Neuromuscular re-education;Therapeutic exercise;Therapeutic activities;Patient/family education;Manual techniques;Dry needling;Scar mobilization;Passive range of motion;Taping;Ultrasound;Joint Manipulations;Spinal Manipulations    PT Next Visit Plan  continue to decrease tenderness and improve mobility of cervical spine, modify TENS use PRN, Internal TP release vs. educate on self release vs. further abdominal/MFR, assess gait?    PT Home Exercise Plan  Squatty potty, and diaphragmatic breathing, side-stretch + rotation, hip-flexor hold-relax stretch (given modification 12-14-18). Heel lift given 12-14-18; taken out broomstick and self sacral mobs; counter top planks; bridges; self myofascial releaes techniques; Bow and arrow, self posterior fourchette, TENS unit for gate control and down-regulating back/pelvic pain.    Consulted and Agree with Plan of Care  Patient       Patient will benefit from skilled therapeutic intervention in order to improve the following deficits and impairments:  Increased fascial restricitons, Improper body mechanics, Pain, Decreased coordination, Decreased scar mobility, Increased muscle spasms, Impaired tone, Postural dysfunction, Decreased activity tolerance, Decreased range of motion, Decreased strength, Hypomobility, Difficulty walking, Impaired flexibility, Impaired vision/preception  Visit Diagnosis: Other muscle spasm  Abnormal posture  Muscular imbalance     Problem List Patient Active Problem List   Diagnosis Date Noted  . Peritoneal adhesions    Willa Rough DPT, ATC Willa Rough 02/22/2019, 12:08 PM  [place] MAIN [facility] SERVICES [address] [place], [place], [address] Phone: [phone]   Fax:  [phone]  Name: Andrea Baird MRN: [id number] Date of Birth: [birth year]

## 2019-03-01 ENCOUNTER — Other Ambulatory Visit: Payer: Self-pay

## 2019-03-01 ENCOUNTER — Ambulatory Visit: Payer: BLUE CROSS/BLUE SHIELD

## 2019-03-01 DIAGNOSIS — R293 Abnormal posture: Secondary | ICD-10-CM

## 2019-03-01 DIAGNOSIS — M6289 Other specified disorders of muscle: Secondary | ICD-10-CM

## 2019-03-01 DIAGNOSIS — M62838 Other muscle spasm: Secondary | ICD-10-CM

## 2019-03-01 DIAGNOSIS — M629 Disorder of muscle, unspecified: Secondary | ICD-10-CM

## 2019-03-01 NOTE — Therapy (Signed)
[facility] MAIN [facility] SERVICES [address] [place], [place], [address] Phone: [phone]   Fax:  [phone]  Physical Therapy Treatment  The patient has been informed of current processes in place at Outpatient Rehab to protect patients from Covid-19 exposure including social distancing, schedule modifications, and new cleaning procedures. After discussing their particular risk with a therapist based on the patient's personal risk factors, the patient has decided to proceed with in-person therapy.   Patient Details  Name: Andrea Baird MRN: [id number] Date of Birth: Jul 01, 1970 Referring Provider (PT): Heloise Ochoa   Encounter Date: 03/01/2019  PT End of Session - 03/01/19 0954    Visit Number  2    Number of Visits  20    Date for PT Re-Evaluation  04/26/19    Authorization Type  BCBS (insurance changing on Mar 11 2019    Authorization Time Period  through 04/26/2019 (from 12-8)    Authorization - Visit Number  2    Authorization - Number of Visits  10    PT Start Time  [phone]    PT Stop Time  0935    PT Time Calculation (min)  60 min    Activity Tolerance  Patient tolerated treatment well;No increased pain    Behavior During Therapy  WFL for tasks assessed/performed       Past Medical History:  Diagnosis Date  . Anemia   . Chronic headaches 2018  . Elevated cholesterol   . GERD (gastroesophageal reflux disease)   . Otosclerosis, right 2019    Past Surgical History:  Procedure Laterality Date  . ABDOMINAL HYSTERECTOMY    . COLONOSCOPY, ESOPHAGOGASTRODUODENOSCOPY (EGD) AND ESOPHAGEAL DILATION    . ESOPHAGOGASTRODUODENOSCOPY (EGD) WITH PROPOFOL N/A 05/05/2017   Procedure: ESOPHAGOGASTRODUODENOSCOPY (EGD) WITH PROPOFOL;  Surgeon: Toledo, Boykin Nearing, MD;  Location: [facility] ENDOSCOPY;  Service: Gastroenterology;  Laterality: N/A;  . HYSTERECTOMY ABDOMINAL WITH SALPINGECTOMY Bilateral 11/2015  . LAPAROSCOPIC ABDOMINAL EXPLORATION  Bilateral 1990  . LAPAROTOMY    . laproscopy    . LYSIS OF ADHESION N/A 06/12/2017   Procedure: LYSIS OF ADHESION;  Surgeon: Christeen Douglas, MD;  Location: [facility] ORS;  Service: Gynecology;  Laterality: N/A;  . TUBAL LIGATION      There were no vitals filed for this visit.   Pelvic Floor Physical Therapy Treatment Note  SCREENING  Changes in medications, allergies, or medical history?: none    SUBJECTIVE  Patient reports: Pain varies between a 3 and an 8, riding in her husband's truck and sex both flare her pain. Was able to get pain down to a tolerable level with use of tens and it lasts about 3 hours.   Precautions:  none  Pain update:  Location of pain: RLQ Current pain:  4/10  Max pain:  8/10 (flare) Least pain:  4/10 Nature of pain: achy/dull  3/10 on NPS at end of session- at low R groin   Patient Goals: Get her pain not to flare up so bad, would love to be pain free.   OBJECTIVE  Changes in: Posture/Observations:  hyperlordosis  ROM/Mobility:  (from previous note not re-assessed today)Decreased spinal mobility from T-L junction up through mid-thoracic spine. R rot ~ 75% of motion with slight pain at end range. L rot ~ 50% of motion with increased spasm and pain into R QL and inner thigh.   Pelvic Floor TTP to R PR, OI internally (radiated mostly to the L hip with palpation) (from previous session)  Abdominal:  Decreased fascial mobility through RLQ. Decreased fascial mobility through R adductors, Most restriction noted at R groin myofascially near inguinal ligament (referred pulling sensation to R calf).   Palpation: TTP to B lumbar multifidus ~ L2-4, R Iliacus, R pectineus  INTERVENTIONS THIS SESSION: Manual: Performed TP release to B lumbar multifidus ~ L2-4, R Iliacus to decrease spasm and pain and allow for improved balance of musculature for improved function and decreased symptoms.  Therex: Educated on self TP release with tennis ball to  muscles surrounding the pelvis to decrease spasm and pain and allow for improved balance of musculature for improved function and decreased symptoms.   Total time: 60 min.                             PT Short Term Goals - 02/15/19 1454      PT SHORT TERM GOAL #1   Title  Patient will demonstrate a coordinated contraction, relaxation, and bulge of the pelvic floor muscles to demonstrate functional recruitment and motion and allow for further strengthening.    Baseline  Pt. Report and prior exam suggests PFM spasms and decreased ROM and coordination.    Time  5    Period  Weeks    Status  Achieved    Target Date  01/05/19      PT SHORT TERM GOAL #2   Title  Patient will demonstrate HEP x1 in the clinic to demonstrate understanding and proper form to allow for further improvement.    Time  5    Period  Weeks    Status  Achieved    Target Date  01/05/19      PT SHORT TERM GOAL #3   Title  Patient will demonstrate improved pelvic allignment in standing to allow for improved muscular balance and to allow relaxation of the PFM for decreased pain with intercourse.    Baseline  R up-slip    Time  5    Period  Weeks    Status  Achieved    Target Date  01/05/19      PT SHORT TERM GOAL #4   Title  Pt. will demonstrate consistent self MFR to lower abdomen and posterior fourchette to allow for scar lengthening and decreased tension on nerves.    Baseline  Pt. not performing MFR    Time  5    Period  Weeks    Status  Achieved    Target Date  01/05/19        PT Long Term Goals - 02/15/19 1455      PT LONG TERM GOAL #1   Title  Patient will score less than or equal to 20% on the Female NIH-CPSI, 15% on the [place], and 15% on the VQ to demonstrate a reduction in pain, urinary symptoms, and an improved quality of life.    Baseline  VQ: 11/33 (33%),  PDI: 24/70 (34%), Female NIH-CPSI: 28/43 (65%)    Time  10    Period  Weeks    Status  On-going    Target Date   04/26/19      PT LONG TERM GOAL #2   Title  Patient will report no pain with intercourse to demonstrate improved functional ability.    Baseline  pain with both penetration and deeper thrusting at certain angles, worse around her cycle.    Time  10    Period  Weeks  Status  On-going    Target Date  04/26/19      PT LONG TERM GOAL #3   Title  Patient will report urinating 6-8 times per day over the course of the prior week to demonstrate decreased frequency.    Baseline  Pt. urinating every hour or more often, Nocturiax3    Time  10    Period  Weeks    Status  On-going    Target Date  04/26/19            Plan - 03/01/19 0955    Clinical Impression Statement  Pt. Responded well to all interventions today, demonstrating decreased spasm and tenderness with palpation, decreased pain by 1 point,  as well as understanding and correct performance of all education and exercises provided today. They will continue to benefit from skilled physical therapy to work toward remaining goals and maximize function as well as decrease likelihood of symptom increase or recurrence.     Personal Factors and Comorbidities  Comorbidity 3+    Comorbidities  Pelvic adhesions, GERD, h/o fibroids, chronic HA, R otosclerosis.    Examination-Activity Limitations  Transfers;Toileting;Stand;Bend;Carry;Sit;Locomotion Level;Dressing;Squat    Examination-Participation Restrictions  Cleaning;Meal Prep;Community Activity;Interpersonal Relationship;Laundry    Stability/Clinical Decision Making  Unstable/Unpredictable    Rehab Potential  Good    Clinical Impairments Affecting Rehab Potential  ovarian cysts, largely untreated chronic pain since 1996    PT Frequency  1x / week    PT Duration  Other (comment)    PT Treatment/Interventions  ADLs/Self Care Home Management;Biofeedback;Aquatic Therapy;Electrical Stimulation;Traction;Moist Heat;Functional mobility training;Neuromuscular re-education;Therapeutic  exercise;Therapeutic activities;Patient/family education;Manual techniques;Dry needling;Scar mobilization;Passive range of motion;Taping;Ultrasound;Joint Manipulations;Spinal Manipulations    PT Next Visit Plan  continue to decrease tenderness and improve mobility of cervical spine, modify TENS use PRN, Internal TP release vs. educate on self release vs. further abdominal/MFR, assess gait?    PT Home Exercise Plan  Squatty potty, and diaphragmatic breathing, side-stretch + rotation, hip-flexor hold-relax stretch (given modification 12-14-18). Heel lift given 12-14-18; taken out broomstick and self sacral mobs; counter top planks; bridges; self myofascial releaes techniques; Bow and arrow, self posterior fourchette, TENS unit for gate control and down-regulating back/pelvic pain.    Consulted and Agree with Plan of Care  Patient       Patient will benefit from skilled therapeutic intervention in order to improve the following deficits and impairments:  Increased fascial restricitons, Improper body mechanics, Pain, Decreased coordination, Decreased scar mobility, Increased muscle spasms, Impaired tone, Postural dysfunction, Decreased activity tolerance, Decreased range of motion, Decreased strength, Hypomobility, Difficulty walking, Impaired flexibility, Impaired vision/preception  Visit Diagnosis: Other muscle spasm  Abnormal posture  Muscular imbalance     Problem List Patient Active Problem List   Diagnosis Date Noted  . Peritoneal adhesions    Cleophus MoltKeeli T. Brayen Bunn DPT, ATC Cleophus MoltKeeli T Ithzel Fedorchak 03/01/2019, 10:17 AM  [place] [facility] [address] [place], [place], [address] Phone: [phone]   Fax:  [phone]  Name: Bonnielee HaffSusan J Genna MRN: [id number] Date of Birth: 12-31-1970

## 2019-03-01 NOTE — Patient Instructions (Signed)
    This is The QL muscle  To perform release on this muscle, start by getting into this position by bridging the hips up and then slowly lowering your back, then your butt down to lengthen the low back then put the ball under you where you feel the tender spot and roll to the same side slightly to add pressure as needed. Hold still and take deep breaths until the pain is at least 50% less or, ideally, just pressure.   This is your piriformis    To release this muscle start in this position with your ankle crossed over the opposite knee. Place the tennis ball under your buttock where the tender spot is and then slightly roll your weight to the same side to put just enough pressure that it is uncomfortable. Hold and take deep breaths until the pain is at least 50% less or, ideally ,just pressure.   This is your Gluteus Medius and Minimus  To perform release on this muscle, start by getting into this position by bridging the hips up and then slowly lowering your back, then your butt down to lengthen the low back then put the ball under you where you feel the tender spot and roll to the same side slightly to add pressure as needed. Hold still and take deep breaths until the pain is at least 50% less or, ideally, just pressure.   These are your deep hip-flexor muscles. They are easiest to reach where they come together at the hip.   To perform release on this muscle, start by getting into this position by laying on your stomach then put the ball under you where you feel the tender spot and bring the opposite knee up/out to the side to add pressure as needed. Hold still and take deep breaths until the pain is at least 50% less or, ideally ,just pressure.   

## 2019-03-09 ENCOUNTER — Ambulatory Visit: Payer: BLUE CROSS/BLUE SHIELD

## 2019-03-10 ENCOUNTER — Ambulatory Visit
Admission: RE | Admit: 2019-03-10 | Discharge: 2019-03-10 | Disposition: A | Discharge status: Home / Self Care | Payer: BLUE CROSS/BLUE SHIELD | Source: Ambulatory Visit | Attending: Family Medicine | Admitting: Family Medicine

## 2019-03-10 ENCOUNTER — Other Ambulatory Visit: Payer: Self-pay

## 2019-03-10 DIAGNOSIS — G930 Cerebral cysts: Secondary | ICD-10-CM | POA: Diagnosis not present

## 2019-03-10 MED ORDER — GADOBUTROL 1 MMOL/ML IV SOLN
7.0000 mL | Freq: Once | INTRAVENOUS | Status: AC | PRN
  Administered 2019-03-10: 16:00:00 7 mL via INTRAVENOUS

## 2019-03-18 ENCOUNTER — Ambulatory Visit: Payer: BLUE CROSS/BLUE SHIELD

## 2020-03-05 ENCOUNTER — Other Ambulatory Visit: Payer: Self-pay

## 2020-03-05 ENCOUNTER — Ambulatory Visit
Admission: RE | Admit: 2020-03-05 | Discharge: 2020-03-05 | Disposition: A | Discharge status: Home / Self Care | Payer: Commercial Managed Care - HMO | Source: Ambulatory Visit | Attending: Family Medicine | Admitting: Family Medicine

## 2020-03-05 ENCOUNTER — Other Ambulatory Visit: Payer: Self-pay | Admitting: Family Medicine

## 2020-03-05 DIAGNOSIS — R1011 Right upper quadrant pain: Secondary | ICD-10-CM

## 2020-03-06 ENCOUNTER — Other Ambulatory Visit: Payer: Self-pay | Admitting: General Surgery

## 2020-03-06 ENCOUNTER — Ambulatory Visit: Payer: Self-pay | Admitting: General Surgery

## 2020-03-06 DIAGNOSIS — R1010 Upper abdominal pain, unspecified: Secondary | ICD-10-CM

## 2020-03-06 DIAGNOSIS — R1011 Right upper quadrant pain: Secondary | ICD-10-CM

## 2020-03-06 NOTE — H&P (View-Only) (Signed)
PATIENT PROFILE: Andrea Baird is a 49 y.o. female who presents to the Clinic for consultation at the request of Dr. Netty Starring for evaluation of right upper quadrant pain.  PCP:  Dion Body, MD  HISTORY OF PRESENT ILLNESS: Andrea Baird reports having right upper current pain since a week ago.  She reported initially it was intermittent with some of her meals but now is getting with every meal and even with every thing that she drinks.  The pain is only aggravated by oral intake.  Pain sometimes improve when she is not eating anything.  The pain is localized to the right upper quadrant and radiates to her right back.  She report associated nausea but no vomiting.  She denies any use of NSAIDs.  She went to see her primary care physician for evaluation of this pain.  Labs were done and there was no leukocytosis and liver enzymes were within normal limits.  Gallbladder ultrasound was order showing no gallbladder wall thickening or pericholecystic fluid.  No gallstones were visualized.  I personally evaluated the images.  Today I ordered lipase and it was within normal limits.   PROBLEM LIST: Problem List  Date Reviewed: 03/05/2020         Noted   Peritoneal adhesions 09/27/2019   Pain of ovary Unknown   Headache disorder 09/14/2018   Chronic daily headache 06/29/2018   Hearing loss 02/11/2018   Overview    Formatting of this note might be different from the original. Added automatically from request for surgery [id number]      Obesity (BMI 30.0-34.9), unspecified 06/18/2017   Pelvic pain in female Unknown   Pure hypercholesterolemia (LDL 91 - 12/26/19) 12/22/2016   GERD without esophagitis 12/18/2016   Menorrhagia 10/17/2015      GENERAL REVIEW OF SYSTEMS:   General ROS: negative for - chills, fatigue, fever, weight gain or weight loss Allergy and Immunology ROS: negative for - hives  Hematological and Lymphatic ROS: negative for - bleeding problems or bruising, negative for palpable  nodes Endocrine ROS: negative for - heat or cold intolerance, hair changes Respiratory ROS: negative for - cough, shortness of breath or wheezing Cardiovascular ROS: no chest pain or palpitations GI ROS: Positive for nausea, vomiting, abdominal pain, negative for diarrhea, constipation Musculoskeletal ROS: negative for - joint swelling or muscle pain Neurological ROS: negative for - confusion, syncope Dermatological ROS: negative for pruritus and rash Psychiatric: negative for anxiety, depression, difficulty sleeping and memory loss  MEDICATIONS: Current Outpatient Medications  Medication Sig Dispense Refill  . atorvastatin (LIPITOR) 40 MG tablet TAKE 1 TABLET BY MOUTH EVERY DAY 90 tablet 1  . calcium/folic ac/multivit-min (VIACTIV MULTI-VITAMIN ORAL) Take 1 tablet by mouth every morning       . estradiol (VIVELLE-DOT) patch 0.1 mg/24 hr Place 1 patch onto the skin twice a week 8 patch 11  . ibuprofen (MOTRIN) 600 MG tablet Take 1 tablet every 6 hours with food for 5 days then as needed. 30 tablet 2  . magnesium oxide 500 mg Tab Take 500 mg by mouth once daily       . nortriptyline (PAMELOR) 25 MG capsule TAKE 1 CAPSULE BY MOUTH EVERY DAY AT BEDTIME . WHEN HAVING HEADACHES TAKE 2 CAPS AS NIGHT 60 capsule 5  . pantoprazole (PROTONIX) 40 MG DR tablet TAKE 1 TABLET (40 MG TOTAL) BY MOUTH 2 (TWO) TIMES DAILY BEFORE MEALS FOR 30 DAYS 180 tablet 1   No current facility-administered medications for this visit.  ALLERGIES: Patient has no known allergies.  PAST MEDICAL HISTORY: Past Medical History:  Diagnosis Date  . Anemia 04/2015  . Dysphagia   . Encounter for blood transfusion 04/2015   anemia  . Fibroid 11/2015   Hysterectomy resolved  . GERD (gastroesophageal reflux disease)   . Obesity (BMI 30.0-34.9), unspecified   . PONV (postoperative nausea and vomiting)   . Poor intravenous access    very difficult stick  . Pure hypercholesterolemia (LDL 157 - 12/22/16) 12/22/2016     PAST SURGICAL HISTORY: Past Surgical History:  Procedure Laterality Date  . COLONOSCOPY W/REMOVAL LESIONS BY SNARE N/A 05/10/2019   Procedure: Colonoscopy;  Surgeon: Irven Baltimore, MD;  Location: [facility];  Service: Gastroenterology;  Laterality: N/A;  . CYSTOURETHROSCOPY W/URETERAL CATHIZATION W/WO RETROGRADE PYELOGRAM Bilateral 07/05/2019   Procedure: CYSTOURETHROSCOPY, WITH URETERAL CATHETERIZATION, WITH OR WITHOUT IRRIGATION, INSTILLATION, OR URETEROPYELOGRAPHY, EXCLUSIVE OF RADIOLOGIC SERVICE;  Surgeon: Alysia Penna, MD;  Location: [facility];  Service: Gynecology;  Laterality: Bilateral;  . egd with dilitation  05/05/2017   Negative Biopsy/No repeat/TKT  . ESOPHAGOGASTRODOUDENOSCOPY W/BIOPSY N/A 04/04/2019   Procedure: ESOPHAGOGASTRODUODENOSCOPY, FLEXIBLE, TRANSORAL; WITH BIOPSY, SINGLE OR MULTIPLE;  Surgeon: Irven Baltimore, MD;  Location: [facility];  Service: Gastroenterology;  Laterality: N/A;  . ESOPHAGOGASTRODOUDENOSCOPY W/INSERTION GUIDEWIRE N/A 04/04/2019   Procedure: ESOPHAGOGASTRODUODENOSCOPY, FLEXIBLE, TRANSORAL; WITH INSERTION OF GUIDE WIRE FOLLOWED BY PASSAGE OF DILATOR(S) THROUGH ESOPHAGUS OVER GUIDE WIRE;  Surgeon: Irven Baltimore, MD;  Location: [facility];  Service: Gastroenterology;  Laterality: N/A;  . EXPLORATORY LAPAROTOMY     hysterectomy poen  . HYSTERECTOMY  11/2015   Removed tubes too  . HYSTERECTOMY TOTAL ABDOMINAL W/REMOVAL TUBES &/OR OVARIES N/A 12/05/2015   Procedure: TOTAL ABDOMINAL HYSTERECTOMY (CORPUS AND CERVIX), WITH BILATERAL REMOVAL OF TUBES;  Surgeon: Dierdre Searles, MD;  Location: [facility];  Service: Obstetrics;  Laterality: N/A;  . LAPAROSCOPIC EXCISION/FULGURATION OVARIAN LESION N/A 07/05/2019   Procedure: ROBOT ASSISTED LAPAROSCOPY, SURGICAL; WITH FULGURATION OR EXCISION OF LESIONS OF THE OVARY, PELVIC VISCERA, OR PERITONEAL SURFACE;  Surgeon: Alysia Penna, MD;  Location: [facility];  Service: Gynecology;  Laterality: N/A;  . LAPAROSCOPY DIAGNOSTIC N/A 12/05/2015   Procedure: LAPAROSCOPY, SURGICAL; ABDOMEN, PERITONEUM, AND OMENTUM, DIAGNOSTIC;  Surgeon: Dierdre Searles, MD;  Location: [facility];  Service: Obstetrics;  Laterality: N/A;  . OOPHORECTOMY    . PELVIC LAPAROSCOPY  1990   Adhesions/endometrial scarring/  . right stapedectomy  03/2018     FAMILY HISTORY: Family History  Problem Relation Age of Onset  . Alcohol abuse Mother   . Hyperlipidemia (Elevated cholesterol) Mother   . Hip fracture Mother   . High blood pressure (Hypertension) Mother   . Osteoarthritis Mother   . Skin cancer Mother        Basal Cell  . Thyroid disease Mother   . Glaucoma Mother   . Colon polyps Mother        pre-cancerous polyps  . Osteoporosis (Thinning of bones) Mother   . Colon polyps Father        High risk (patient reports was "cancerous") polyp  . Skin cancer Father        Pre cancerous melanomas  . Prostate cancer Father   . Colon cancer Father   . Melanoma Father   . No Known Problems Sister   . Deep vein thrombosis (DVT or abnormal blood clot formation) Maternal Uncle   . Diabetes type II Maternal Uncle   . High blood  pressure (Hypertension) Maternal Uncle   . Stroke Maternal Uncle   . Diabetes Maternal Uncle   . High blood pressure (Hypertension) Maternal Aunt   . Stroke Maternal Aunt   . High blood pressure (Hypertension) Maternal Grandmother   . Obesity Maternal Grandmother   . Osteoarthritis Maternal Grandmother   . Heart disease Maternal Grandmother   . Stroke Maternal Aunt   . Stroke Maternal Grandfather   . Asthma Maternal Grandfather   . Heart disease Maternal Grandfather   . Prostate cancer Paternal Grandfather   . Anesthesia problems Neg Hx      SOCIAL HISTORY: Social History   Socioeconomic History  . Marital status: Married    Spouse name: Not on file  . Number of children: Not on file  . Years of education: Not on file  . Highest  education level: Not on file  Occupational History  . Not on file  Tobacco Use  . Smoking status: Never Smoker  . Smokeless tobacco: Never Used  . Tobacco comment: Parents Smoked while growing up, I never have nor does anyone living with me  Vaping Use  . Vaping Use: Never used  Substance and Sexual Activity  . Alcohol use: Never  . Drug use: Never  . Sexual activity: Yes    Partners: Male    Birth control/protection: Surgical  Other Topics Concern  . Would you please tell us about the people who live in your home, your pets, or anything else important to your social life? Yes    Comment: live w/ husband Roderic Palau ,daughter (shes 27) a dog and a cat  Social History Narrative   Marital Status- Married   Lives with husband   Employment- Film/video editor   Exercise hx- Occasional   Religious Affiliation- Psychologist, forensic   Social Determinants of Health   Financial Resource Strain: Not on file  Food Insecurity: Not on file  Transportation Needs: Not on file    PHYSICAL EXAM: Vitals:   Body mass index is 34.87 kg/m. Weight: (!) 110.2 kg (243 lb)   GENERAL: Alert, active, oriented x3  HEENT: Pupils equal reactive to light. Extraocular movements are intact. Sclera clear. Palpebral conjunctiva normal red color.Pharynx clear.  NECK: Supple with no palpable mass and no adenopathy.  LUNGS: Sound clear with no rales rhonchi or wheezes.  HEART: Regular rhythm S1 and S2 without murmur.  ABDOMEN: Soft and depressible, tender to palpation in the right upper quadrant, no hepatomegaly.   EXTREMITIES: Well-developed well-nourished symmetrical with no dependent edema.  NEUROLOGICAL: Awake alert oriented, facial expression symmetrical, moving all extremities.  REVIEW OF DATA: I have reviewed the following data today: Initial consult on 03/06/2020  Component Date Value  . Lipase 03/06/2020 20   Office Visit on 03/05/2020  Component Date Value  . WBC (White Blood Cell Co* 03/05/2020 4.8    . RBC (Red Blood Cell Coun* 03/05/2020 4.89   . Hemoglobin 03/05/2020 14.3   . Hematocrit 03/05/2020 44.0   . MCV (Mean Corpuscular Vo* 03/05/2020 90.0   . MCH (Mean Corpuscular He* 03/05/2020 29.2   . MCHC (Mean Corpuscular H* 03/05/2020 32.5   . Platelet Count 03/05/2020 296   . RDW-CV (Red Cell Distrib* 03/05/2020 13.8   . MPV (Mean Platelet Volum* 03/05/2020 9.9   . Neutrophils 03/05/2020 2.63   . Lymphocytes 03/05/2020 1.65   . Monocytes 03/05/2020 0.40   . Eosinophils 03/05/2020 0.09   . Basophils 03/05/2020 0.03   . Neutrophil % 03/05/2020 54.8   . Lymphocyte %  03/05/2020 34.4   . Monocyte % 03/05/2020 8.3   . Eosinophil % 03/05/2020 1.9   . Basophil% 03/05/2020 0.6   . Immature Granulocyte % 03/05/2020 0.0   . Immature Granulocyte Cou* 03/05/2020 0.00   . Glucose 03/05/2020 93   . Sodium 03/05/2020 138   . Potassium 03/05/2020 4.5   . Chloride 03/05/2020 105   . Carbon Dioxide (CO2) 03/05/2020 25.5   . Urea Nitrogen (BUN) 03/05/2020 9   . Creatinine 03/05/2020 0.6   . Glomerular Filtration Ra* 03/05/2020 106   . Calcium 03/05/2020 9.9   . AST  03/05/2020 16   . ALT  03/05/2020 13   . Alk Phos (alkaline Phosp* 03/05/2020 71   . Albumin 03/05/2020 4.5   . Bilirubin, Total 03/05/2020 0.5   . Protein, Total 03/05/2020 7.0   . A/G Ratio 03/05/2020 1.8   Appointment on 12/26/2019  Component Date Value  . WBC (White Blood Cell Co* 12/26/2019 5.4   . RBC (Red Blood Cell Coun* 12/26/2019 5.01   . Hemoglobin 12/26/2019 14.5   . Hematocrit 12/26/2019 45.4   . MCV (Mean Corpuscular Vo* 12/26/2019 90.6   . MCH (Mean Corpuscular He* 12/26/2019 28.9   . MCHC (Mean Corpuscular H* 12/26/2019 31.9*  . Platelet Count 12/26/2019 270   . RDW-CV (Red Cell Distrib* 12/26/2019 13.4   . MPV (Mean Platelet Volum* 12/26/2019 10.6   . Neutrophils 12/26/2019 2.86   . Lymphocytes 12/26/2019 1.85   . Monocytes 12/26/2019 0.49   . Eosinophils 12/26/2019 0.12   . Basophils 12/26/2019  0.04   . Neutrophil % 12/26/2019 53.2   . Lymphocyte % 12/26/2019 34.4   . Monocyte % 12/26/2019 9.1   . Eosinophil % 12/26/2019 2.2   . Basophil% 12/26/2019 0.7   . Immature Granulocyte % 12/26/2019 0.4   . Immature Granulocyte Cou* 12/26/2019 0.02   . Glucose 12/26/2019 87   . Sodium 12/26/2019 139   . Potassium 12/26/2019 4.4   . Chloride 12/26/2019 106   . Carbon Dioxide (CO2) 12/26/2019 23.9   . Urea Nitrogen (BUN) 12/26/2019 13   . Creatinine 12/26/2019 0.7   . Glomerular Filtration Ra* 12/26/2019 89   . Calcium 12/26/2019 10.1   . AST  12/26/2019 18   . ALT  12/26/2019 11   . Alk Phos (alkaline Phosp* 12/26/2019 73   . Albumin 12/26/2019 4.3   . Bilirubin, Total 12/26/2019 0.5   . Protein, Total 12/26/2019 6.8   . A/G Ratio 12/26/2019 1.7   . Cholesterol, Total 12/26/2019 205*  . Triglyceride 12/26/2019 65   . HDL (High Density Lipopr* 12/26/2019 101.4*  . LDL Calculated 12/26/2019 91   . VLDL Cholesterol 12/26/2019 13   . Cholesterol/HDL Ratio 12/26/2019 2.0   . Hemoglobin A1C 12/26/2019 5.3   . Average Blood Glucose (C* 12/26/2019 105      ASSESSMENT: Ms. Looper is a 49 y.o. female presenting for consultation for right upper quadrant pain.    Patient was oriented about the differential diagnosis of right upper quadrant pain. Also oriented about what is the gallbladder, its anatomy and function and the implications of having stones or gallbladder low ejection fraction. The patient was oriented about the treatment alternatives (observation vs cholecystectomy). Patient was oriented that a low percentage of patient will continue to have similar pain symptoms even after the gallbladder is removed. Surgical technique (open vs laparoscopic) was discussed. It was also discussed the goals of the surgery (decrease the pain episodes and avoid  the risk of cholecystitis) and the risk of surgery including: bleeding, infection, common bile duct injury, stone retention, injury to  other organs such as bowel, liver, stomach, other complications such as hernia, bowel obstruction among others. Also discussed with patient about anesthesia and its complications such as: reaction to medications, pneumonia, heart complications, death, among others.   This is a very complicated case because ultrasound does not shows gallstones or any sign of gallbladder inflammation.  The patient clinically presents with right upper current pain with meals and basically everything she eats or drinks.  The pain localized to the right upper quadrant and radiates to the back very classic of biliary colic.  Even though the ultrasound does not shows gallstones or any other sign of cholecystitis clinically the patient is behaving as biliary colic.  The lipase is negative so pancreatitis was ruled out.  Patient had an endoscopy less than a year ago which shows normal gastric mucosa.  The patient does not take NSAIDs.  I order a stat HIDA scan that cannot be done until next week as per nuclear medicine department.  I had a long discussion with the patient about the alternative of proceeding with cholecystectomy since the other causes of right upper quadrant are less likely.  She does understand that there is a possibility of persistent abdominal pain.  The fact that the patient is not able to basically eat or drink anything I will try to coordinate these electively as soon as possible, if not she will need to go to the emergency room for admission.  She is willing to wait until Thursday for surgical management.  Pain of upper abdomen [R10.10]  PLAN: 1.  Robotic assisted laparoscopic cholecystectomy ([id number]) 2.  CBC, CMP (done) 3.  Do not take aspirin 5 days before the procedure 4.  Contact us if has any question or concern.   Patient verbalized understanding, all questions were answered, and were agreeable with the plan outlined above.

## 2020-03-06 NOTE — H&P (Signed)
PATIENT PROFILE: Andrea Baird is a 48 y.o. female who presents to the Clinic for consultation at the request of Dr. Netty Starring for evaluation of right upper quadrant pain.  PCP:  Dion Body, MD  HISTORY OF PRESENT ILLNESS: Andrea Baird reports having right upper current pain since a week ago.  She reported initially it was intermittent with some of her meals but now is getting with every meal and even with every thing that she drinks.  The pain is only aggravated by oral intake.  Pain sometimes improve when she is not eating anything.  The pain is localized to the right upper quadrant and radiates to her right back.  She report associated nausea but no vomiting.  She denies any use of NSAIDs.  She went to see her primary care physician for evaluation of this pain.  Labs were done and there was no leukocytosis and liver enzymes were within normal limits.  Gallbladder ultrasound was order showing no gallbladder wall thickening or pericholecystic fluid.  No gallstones were visualized.  I personally evaluated the images.  Today I ordered lipase and it was within normal limits.   PROBLEM LIST: Problem List  Date Reviewed: 03/05/2020         Noted   Peritoneal adhesions 09/27/2019   Pain of ovary Unknown   Headache disorder 09/14/2018   Chronic daily headache 06/29/2018   Hearing loss 02/11/2018   Overview    Formatting of this note might be different from the original. Added automatically from request for surgery [id number]      Obesity (BMI 30.0-34.9), unspecified 06/18/2017   Pelvic pain in female Unknown   Pure hypercholesterolemia (LDL 91 - 12/26/19) 12/22/2016   GERD without esophagitis 12/18/2016   Menorrhagia 10/17/2015      GENERAL REVIEW OF SYSTEMS:   General ROS: negative for - chills, fatigue, fever, weight gain or weight loss Allergy and Immunology ROS: negative for - hives  Hematological and Lymphatic ROS: negative for - bleeding problems or bruising, negative for palpable  nodes Endocrine ROS: negative for - heat or cold intolerance, hair changes Respiratory ROS: negative for - cough, shortness of breath or wheezing Cardiovascular ROS: no chest pain or palpitations GI ROS: Positive for nausea, vomiting, abdominal pain, negative for diarrhea, constipation Musculoskeletal ROS: negative for - joint swelling or muscle pain Neurological ROS: negative for - confusion, syncope Dermatological ROS: negative for pruritus and rash Psychiatric: negative for anxiety, depression, difficulty sleeping and memory loss  MEDICATIONS: Current Outpatient Medications  Medication Sig Dispense Refill  . atorvastatin (LIPITOR) 40 MG tablet TAKE 1 TABLET BY MOUTH EVERY DAY 90 tablet 1  . calcium/folic ac/multivit-min (VIACTIV MULTI-VITAMIN ORAL) Take 1 tablet by mouth every morning       . estradiol (VIVELLE-DOT) patch 0.1 mg/24 hr Place 1 patch onto the skin twice a week 8 patch 11  . ibuprofen (MOTRIN) 600 MG tablet Take 1 tablet every 6 hours with food for 5 days then as needed. 30 tablet 2  . magnesium oxide 500 mg Tab Take 500 mg by mouth once daily       . nortriptyline (PAMELOR) 25 MG capsule TAKE 1 CAPSULE BY MOUTH EVERY DAY AT BEDTIME . WHEN HAVING HEADACHES TAKE 2 CAPS AS NIGHT 60 capsule 5  . pantoprazole (PROTONIX) 40 MG DR tablet TAKE 1 TABLET (40 MG TOTAL) BY MOUTH 2 (TWO) TIMES DAILY BEFORE MEALS FOR 30 DAYS 180 tablet 1   No current facility-administered medications for this visit.  ALLERGIES: Patient has no known allergies.  PAST MEDICAL HISTORY: Past Medical History:  Diagnosis Date  . Anemia 04/2015  . Dysphagia   . Encounter for blood transfusion 04/2015   anemia  . Fibroid 11/2015   Hysterectomy resolved  . GERD (gastroesophageal reflux disease)   . Obesity (BMI 30.0-34.9), unspecified   . PONV (postoperative nausea and vomiting)   . Poor intravenous access    very difficult stick  . Pure hypercholesterolemia (LDL 157 - 12/22/16) 12/22/2016     PAST SURGICAL HISTORY: Past Surgical History:  Procedure Laterality Date  . COLONOSCOPY W/REMOVAL LESIONS BY SNARE N/A 05/10/2019   Procedure: Colonoscopy;  Surgeon: Irven Baltimore, MD;  Location: [place];  Service: Gastroenterology;  Laterality: N/A;  . CYSTOURETHROSCOPY W/URETERAL CATHIZATION W/WO RETROGRADE PYELOGRAM Bilateral 07/05/2019   Procedure: CYSTOURETHROSCOPY, WITH URETERAL CATHETERIZATION, WITH OR WITHOUT IRRIGATION, INSTILLATION, OR URETEROPYELOGRAPHY, EXCLUSIVE OF RADIOLOGIC SERVICE;  Surgeon: Alysia Penna, MD;  Location: [place];  Service: Gynecology;  Laterality: Bilateral;  . egd with dilitation  05/05/2017   Negative Biopsy/No repeat/TKT  . ESOPHAGOGASTRODOUDENOSCOPY W/BIOPSY N/A 04/04/2019   Procedure: ESOPHAGOGASTRODUODENOSCOPY, FLEXIBLE, TRANSORAL; WITH BIOPSY, SINGLE OR MULTIPLE;  Surgeon: Irven Baltimore, MD;  Location: [place];  Service: Gastroenterology;  Laterality: N/A;  . ESOPHAGOGASTRODOUDENOSCOPY W/INSERTION GUIDEWIRE N/A 04/04/2019   Procedure: ESOPHAGOGASTRODUODENOSCOPY, FLEXIBLE, TRANSORAL; WITH INSERTION OF GUIDE WIRE FOLLOWED BY PASSAGE OF DILATOR(S) THROUGH ESOPHAGUS OVER GUIDE WIRE;  Surgeon: Irven Baltimore, MD;  Location: [place];  Service: Gastroenterology;  Laterality: N/A;  . EXPLORATORY LAPAROTOMY     hysterectomy poen  . HYSTERECTOMY  11/2015   Removed tubes too  . HYSTERECTOMY TOTAL ABDOMINAL W/REMOVAL TUBES &/OR OVARIES N/A 12/05/2015   Procedure: TOTAL ABDOMINAL HYSTERECTOMY (CORPUS AND CERVIX), WITH BILATERAL REMOVAL OF TUBES;  Surgeon: Dierdre Searles, MD;  Location: [place];  Service: Obstetrics;  Laterality: N/A;  . LAPAROSCOPIC EXCISION/FULGURATION OVARIAN LESION N/A 07/05/2019   Procedure: ROBOT ASSISTED LAPAROSCOPY, SURGICAL; WITH FULGURATION OR EXCISION OF LESIONS OF THE OVARY, PELVIC VISCERA, OR PERITONEAL SURFACE;  Surgeon: Alysia Penna, MD;  Location: [place];  Service: Gynecology;  Laterality: N/A;  . LAPAROSCOPY DIAGNOSTIC N/A 12/05/2015   Procedure: LAPAROSCOPY, SURGICAL; ABDOMEN, PERITONEUM, AND OMENTUM, DIAGNOSTIC;  Surgeon: Dierdre Searles, MD;  Location: [place];  Service: Obstetrics;  Laterality: N/A;  . OOPHORECTOMY    . PELVIC LAPAROSCOPY  1990   Adhesions/endometrial scarring/  . right stapedectomy  03/2018     FAMILY HISTORY: Family History  Problem Relation Age of Onset  . Alcohol abuse Mother   . Hyperlipidemia (Elevated cholesterol) Mother   . Hip fracture Mother   . High blood pressure (Hypertension) Mother   . Osteoarthritis Mother   . Skin cancer Mother        Basal Cell  . Thyroid disease Mother   . Glaucoma Mother   . Colon polyps Mother        pre-cancerous polyps  . Osteoporosis (Thinning of bones) Mother   . Colon polyps Father        High risk (patient reports was "cancerous") polyp  . Skin cancer Father        Pre cancerous melanomas  . Prostate cancer Father   . Colon cancer Father   . Melanoma Father   . No Known Problems Sister   . Deep vein thrombosis (DVT or abnormal blood clot formation) Maternal Uncle   . Diabetes type II Maternal Uncle   . High blood  pressure (Hypertension) Maternal Uncle   . Stroke Maternal Uncle   . Diabetes Maternal Uncle   . High blood pressure (Hypertension) Maternal Aunt   . Stroke Maternal Aunt   . High blood pressure (Hypertension) Maternal Grandmother   . Obesity Maternal Grandmother   . Osteoarthritis Maternal Grandmother   . Heart disease Maternal Grandmother   . Stroke Maternal Aunt   . Stroke Maternal Grandfather   . Asthma Maternal Grandfather   . Heart disease Maternal Grandfather   . Prostate cancer Paternal Grandfather   . Anesthesia problems Neg Hx      SOCIAL HISTORY: Social History   Socioeconomic History  . Marital status: Married    Spouse name: Not on file  . Number of children: Not on file  . Years of education: Not on file  . Highest  education level: Not on file  Occupational History  . Not on file  Tobacco Use  . Smoking status: Never Smoker  . Smokeless tobacco: Never Used  . Tobacco comment: Parents Smoked while growing up, I never have nor does anyone living with me  Vaping Use  . Vaping Use: Never used  Substance and Sexual Activity  . Alcohol use: Never  . Drug use: Never  . Sexual activity: Yes    Partners: Male    Birth control/protection: Surgical  Other Topics Concern  . Would you please tell us about the people who live in your home, your pets, or anything else important to your social life? Yes    Comment: live w/ husband Roderic Palau ,daughter (shes 3) a dog and a cat  Social History Narrative   Marital Status- Married   Lives with husband   Employment- Film/video editor   Exercise hx- Occasional   Religious Affiliation- Psychologist, forensic   Social Determinants of Health   Financial Resource Strain: Not on file  Food Insecurity: Not on file  Transportation Needs: Not on file    PHYSICAL EXAM: Vitals:   Body mass index is 34.87 kg/m. Weight: (!) 110.2 kg (243 lb)   GENERAL: Alert, active, oriented x3  HEENT: Pupils equal reactive to light. Extraocular movements are intact. Sclera clear. Palpebral conjunctiva normal red color.Pharynx clear.  NECK: Supple with no palpable mass and no adenopathy.  LUNGS: Sound clear with no rales rhonchi or wheezes.  HEART: Regular rhythm S1 and S2 without murmur.  ABDOMEN: Soft and depressible, tender to palpation in the right upper quadrant, no hepatomegaly.   EXTREMITIES: Well-developed well-nourished symmetrical with no dependent edema.  NEUROLOGICAL: Awake alert oriented, facial expression symmetrical, moving all extremities.  REVIEW OF DATA: I have reviewed the following data today: Initial consult on 03/06/2020  Component Date Value  . Lipase 03/06/2020 20   Office Visit on 03/05/2020  Component Date Value  . WBC (White Blood Cell Co* 03/05/2020 4.8    . RBC (Red Blood Cell Coun* 03/05/2020 4.89   . Hemoglobin 03/05/2020 14.3   . Hematocrit 03/05/2020 44.0   . MCV (Mean Corpuscular Vo* 03/05/2020 90.0   . MCH (Mean Corpuscular He* 03/05/2020 29.2   . MCHC (Mean Corpuscular H* 03/05/2020 32.5   . Platelet Count 03/05/2020 296   . RDW-CV (Red Cell Distrib* 03/05/2020 13.8   . MPV (Mean Platelet Volum* 03/05/2020 9.9   . Neutrophils 03/05/2020 2.63   . Lymphocytes 03/05/2020 1.65   . Monocytes 03/05/2020 0.40   . Eosinophils 03/05/2020 0.09   . Basophils 03/05/2020 0.03   . Neutrophil % 03/05/2020 54.8   . Lymphocyte %  03/05/2020 34.4   . Monocyte % 03/05/2020 8.3   . Eosinophil % 03/05/2020 1.9   . Basophil% 03/05/2020 0.6   . Immature Granulocyte % 03/05/2020 0.0   . Immature Granulocyte Cou* 03/05/2020 0.00   . Glucose 03/05/2020 93   . Sodium 03/05/2020 138   . Potassium 03/05/2020 4.5   . Chloride 03/05/2020 105   . Carbon Dioxide (CO2) 03/05/2020 25.5   . Urea Nitrogen (BUN) 03/05/2020 9   . Creatinine 03/05/2020 0.6   . Glomerular Filtration Ra* 03/05/2020 106   . Calcium 03/05/2020 9.9   . AST  03/05/2020 16   . ALT  03/05/2020 13   . Alk Phos (alkaline Phosp* 03/05/2020 71   . Albumin 03/05/2020 4.5   . Bilirubin, Total 03/05/2020 0.5   . Protein, Total 03/05/2020 7.0   . A/G Ratio 03/05/2020 1.8   Appointment on 12/26/2019  Component Date Value  . WBC (White Blood Cell Co* 12/26/2019 5.4   . RBC (Red Blood Cell Coun* 12/26/2019 5.01   . Hemoglobin 12/26/2019 14.5   . Hematocrit 12/26/2019 45.4   . MCV (Mean Corpuscular Vo* 12/26/2019 90.6   . MCH (Mean Corpuscular He* 12/26/2019 28.9   . MCHC (Mean Corpuscular H* 12/26/2019 31.9*  . Platelet Count 12/26/2019 270   . RDW-CV (Red Cell Distrib* 12/26/2019 13.4   . MPV (Mean Platelet Volum* 12/26/2019 10.6   . Neutrophils 12/26/2019 2.86   . Lymphocytes 12/26/2019 1.85   . Monocytes 12/26/2019 0.49   . Eosinophils 12/26/2019 0.12   . Basophils 12/26/2019  0.04   . Neutrophil % 12/26/2019 53.2   . Lymphocyte % 12/26/2019 34.4   . Monocyte % 12/26/2019 9.1   . Eosinophil % 12/26/2019 2.2   . Basophil% 12/26/2019 0.7   . Immature Granulocyte % 12/26/2019 0.4   . Immature Granulocyte Cou* 12/26/2019 0.02   . Glucose 12/26/2019 87   . Sodium 12/26/2019 139   . Potassium 12/26/2019 4.4   . Chloride 12/26/2019 106   . Carbon Dioxide (CO2) 12/26/2019 23.9   . Urea Nitrogen (BUN) 12/26/2019 13   . Creatinine 12/26/2019 0.7   . Glomerular Filtration Ra* 12/26/2019 89   . Calcium 12/26/2019 10.1   . AST  12/26/2019 18   . ALT  12/26/2019 11   . Alk Phos (alkaline Phosp* 12/26/2019 73   . Albumin 12/26/2019 4.3   . Bilirubin, Total 12/26/2019 0.5   . Protein, Total 12/26/2019 6.8   . A/G Ratio 12/26/2019 1.7   . Cholesterol, Total 12/26/2019 205*  . Triglyceride 12/26/2019 65   . HDL (High Density Lipopr* 12/26/2019 101.4*  . LDL Calculated 12/26/2019 91   . VLDL Cholesterol 12/26/2019 13   . Cholesterol/HDL Ratio 12/26/2019 2.0   . Hemoglobin A1C 12/26/2019 5.3   . Average Blood Glucose (C* 12/26/2019 105      ASSESSMENT: Ms. Solorzano is a 49 y.o. female presenting for consultation for right upper quadrant pain.    Patient was oriented about the differential diagnosis of right upper quadrant pain. Also oriented about what is the gallbladder, its anatomy and function and the implications of having stones or gallbladder low ejection fraction. The patient was oriented about the treatment alternatives (observation vs cholecystectomy). Patient was oriented that a low percentage of patient will continue to have similar pain symptoms even after the gallbladder is removed. Surgical technique (open vs laparoscopic) was discussed. It was also discussed the goals of the surgery (decrease the pain episodes and avoid  the risk of cholecystitis) and the risk of surgery including: bleeding, infection, common bile duct injury, stone retention, injury to  other organs such as bowel, liver, stomach, other complications such as hernia, bowel obstruction among others. Also discussed with patient about anesthesia and its complications such as: reaction to medications, pneumonia, heart complications, death, among others.   This is a very complicated case because ultrasound does not shows gallstones or any sign of gallbladder inflammation.  The patient clinically presents with right upper current pain with meals and basically everything she eats or drinks.  The pain localized to the right upper quadrant and radiates to the back very classic of biliary colic.  Even though the ultrasound does not shows gallstones or any other sign of cholecystitis clinically the patient is behaving as biliary colic.  The lipase is negative so pancreatitis was ruled out.  Patient had an endoscopy less than a year ago which shows normal gastric mucosa.  The patient does not take NSAIDs.  I order a stat HIDA scan that cannot be done until next week as per nuclear medicine department.  I had a long discussion with the patient about the alternative of proceeding with cholecystectomy since the other causes of right upper quadrant are less likely.  She does understand that there is a possibility of persistent abdominal pain.  The fact that the patient is not able to basically eat or drink anything I will try to coordinate these electively as soon as possible, if not she will need to go to the emergency room for admission.  She is willing to wait until Thursday for surgical management.  Pain of upper abdomen [R10.10]  PLAN: 1.  Robotic assisted laparoscopic cholecystectomy (00511) 2.  CBC, CMP (done) 3.  Do not take aspirin 5 days before the procedure 4.  Contact us if has any question or concern.   Patient verbalized understanding, all questions were answered, and were agreeable with the plan outlined above.

## 2020-03-07 ENCOUNTER — Other Ambulatory Visit
Admission: RE | Admit: 2020-03-07 | Discharge: 2020-03-07 | Disposition: A | Discharge status: Home / Self Care | Payer: Commercial Managed Care - HMO | Source: Ambulatory Visit | Attending: General Surgery | Admitting: General Surgery

## 2020-03-07 ENCOUNTER — Other Ambulatory Visit: Payer: Self-pay

## 2020-03-07 DIAGNOSIS — Z01812 Encounter for preprocedural laboratory examination: Secondary | ICD-10-CM | POA: Insufficient documentation

## 2020-03-07 DIAGNOSIS — Z20822 Contact with and (suspected) exposure to covid-19: Secondary | ICD-10-CM | POA: Diagnosis not present

## 2020-03-07 HISTORY — DX: Other complications of anesthesia, initial encounter: T88.59XA

## 2020-03-07 HISTORY — DX: Other specified postprocedural states: Z98.890

## 2020-03-07 HISTORY — DX: Other specified postprocedural states: R11.2

## 2020-03-07 LAB — SARS CORONAVIRUS 2 (TAT 6-24 HRS): SARS Coronavirus 2: NEGATIVE

## 2020-03-07 NOTE — Patient Instructions (Signed)
Your procedure is scheduled on: Thursday March 08, 2020. Report to Day Surgery inside [facility] 2nd floor (stop by Registration desk first floor). To find out your arrival time please call [phone] between 1PM - 3PM on Wednesday March 07, 2020.  Remember: Instructions that are not followed completely may result in serious medical risk,  up to and including death, or upon the discretion of your surgeon and anesthesiologist your  surgery may need to be rescheduled.     _X__ 1. Do not eat food after midnight the night before your procedure.                 No chewing gum or hard candies. You may drink clear liquids up to 2 hours                 before you are scheduled to arrive for your surgery- DO not drink clear                 liquids within 2 hours of the start of your surgery.                 Clear Liquids include:  water, apple juice without pulp, clear Gatorade, G2 or                  Gatorade Zero (avoid Red/Purple/Blue), Black Coffee or Tea (Do not add                 anything to coffee or tea).  __X__2.  On the morning of surgery brush your teeth with toothpaste and water, you                may rinse your mouth with mouthwash if you wish.  Do not swallow any toothpaste of mouthwash.     _X__ 3.  No Alcohol for 24 hours before or after surgery.   _X__ 4.  Do Not Smoke or use e-cigarettes For 24 Hours Prior to Your Surgery.                 Do not use any chewable tobacco products for at least 6 hours prior to                 Surgery.  _X__  5.  Do not use any recreational drugs (marijuana, cocaine, heroin, ecstasy, MDMA or other)                For at least one week prior to your surgery.  Combination of these drugs with anesthesia                May have life threatening results.   __X__ 6.  Notify your doctor if there is any change in your medical condition      (cold, fever, infections).     Do not wear jewelry, make-up, hairpins, clips  or nail polish. Do not wear lotions, powders, or perfumes. You may wear deodorant. Do not shave 48 hours prior to surgery. Men may shave face and neck. Do not bring valuables to the hospital.    [facility] is not responsible for any belongings or valuables.  Contacts, dentures or bridgework may not be worn into surgery. Leave your suitcase in the car. After surgery it may be brought to your room. For patients admitted to the hospital, discharge time is determined by your treatment team.   Patients discharged the day of surgery will not be allowed to drive home.  Make arrangements for someone to be with you for the first 24 hours of your Same Day Discharge.   __X__ Take these medicines the morning of surgery with A SIP OF WATER:    1. pantoprazole (PROTONIX) 40 MG    2. atorvastatin (LIPITOR) 40 MG tablet   ____ Fleet Enema (as directed)   __x__ Use Antibacterial Soap  as directed  ____ Use Benzoyl Peroxide Gel as instructed  ____ Use inhalers on the day of surgery  ____ Stop metformin 2 days prior to surgery    __X__ Stop Anti-inflammatories such as Ibuprofen, Aleve, Advil, naproxen, aspirin and or BC powders.    __X__ Stop supplements until after surgery.    __X__ Do not start any herbal supplements before your procedure.    If you have any questions regarding your pre-procedure instructions,  Please call Pre-admit Testing at [phone].

## 2020-03-12 ENCOUNTER — Ambulatory Visit: Payer: 59 | Admitting: Anesthesiology

## 2020-03-12 ENCOUNTER — Ambulatory Visit
Admission: RE | Admit: 2020-03-12 | Discharge: 2020-03-12 | Disposition: A | Discharge status: Home / Self Care | Payer: 59 | Attending: General Surgery | Admitting: General Surgery

## 2020-03-12 ENCOUNTER — Other Ambulatory Visit: Payer: Self-pay

## 2020-03-12 ENCOUNTER — Encounter
Admission: RE | Disposition: A | Discharge status: Home / Self Care | Payer: Self-pay | Source: Home / Self Care | Attending: General Surgery

## 2020-03-12 ENCOUNTER — Other Ambulatory Visit
Admission: RE | Admit: 2020-03-12 | Discharge: 2020-03-12 | Disposition: A | Discharge status: Home / Self Care | Payer: 59 | Source: Ambulatory Visit | Attending: General Surgery | Admitting: General Surgery

## 2020-03-12 ENCOUNTER — Encounter: Payer: Self-pay | Admitting: General Surgery

## 2020-03-12 DIAGNOSIS — Z79899 Other long term (current) drug therapy: Secondary | ICD-10-CM | POA: Diagnosis not present

## 2020-03-12 DIAGNOSIS — Z20822 Contact with and (suspected) exposure to covid-19: Secondary | ICD-10-CM | POA: Insufficient documentation

## 2020-03-12 DIAGNOSIS — K811 Chronic cholecystitis: Secondary | ICD-10-CM | POA: Diagnosis not present

## 2020-03-12 DIAGNOSIS — K8020 Calculus of gallbladder without cholecystitis without obstruction: Secondary | ICD-10-CM | POA: Diagnosis present

## 2020-03-12 DIAGNOSIS — Z01812 Encounter for preprocedural laboratory examination: Secondary | ICD-10-CM | POA: Insufficient documentation

## 2020-03-12 DIAGNOSIS — R1011 Right upper quadrant pain: Secondary | ICD-10-CM | POA: Insufficient documentation

## 2020-03-12 LAB — SARS CORONAVIRUS 2 (TAT 6-24 HRS): SARS Coronavirus 2: NEGATIVE

## 2020-03-12 SURGERY — CHOLECYSTECTOMY, ROBOT-ASSISTED, LAPAROSCOPIC
Anesthesia: General | Site: Abdomen

## 2020-03-12 MED ORDER — FENTANYL CITRATE (PF) 100 MCG/2ML IJ SOLN
INTRAMUSCULAR | Status: DC | PRN
  Administered 2020-03-12 (×2): 50 ug via INTRAVENOUS
  Administered 2020-03-12 (×2): 25 ug via INTRAVENOUS
  Administered 2020-03-12 (×3): 50 ug via INTRAVENOUS

## 2020-03-12 MED ORDER — LIDOCAINE HCL (CARDIAC) PF 100 MG/5ML IV SOSY
PREFILLED_SYRINGE | INTRAVENOUS | Status: DC | PRN
  Administered 2020-03-12: 100 mg via INTRAVENOUS

## 2020-03-12 MED ORDER — PROPOFOL 500 MG/50ML IV EMUL
INTRAVENOUS | Status: DC | PRN
  Administered 2020-03-12: 20 ug/kg/min via INTRAVENOUS

## 2020-03-12 MED ORDER — FENTANYL CITRATE (PF) 100 MCG/2ML IJ SOLN
25.0000 ug | INTRAMUSCULAR | Status: DC | PRN
Start: 2020-03-12 — End: 2020-03-13

## 2020-03-12 MED ORDER — SUGAMMADEX SODIUM 500 MG/5ML IV SOLN
INTRAVENOUS | Status: DC | PRN
  Administered 2020-03-12: 250 mg via INTRAVENOUS

## 2020-03-12 MED ORDER — CEFAZOLIN SODIUM-DEXTROSE 2-4 GM/100ML-% IV SOLN
2.0000 g | INTRAVENOUS | Status: AC
  Administered 2020-03-12: 2 g via INTRAVENOUS

## 2020-03-12 MED ORDER — MIDAZOLAM HCL 2 MG/2ML IJ SOLN
INTRAMUSCULAR | Status: AC
  Filled 2020-03-12: qty 2

## 2020-03-12 MED ORDER — DEXAMETHASONE SODIUM PHOSPHATE 10 MG/ML IJ SOLN
INTRAMUSCULAR | Status: DC | PRN
  Administered 2020-03-12: 10 mg via INTRAVENOUS

## 2020-03-12 MED ORDER — DEXMEDETOMIDINE (PRECEDEX) IN NS 20 MCG/5ML (4 MCG/ML) IV SYRINGE
PREFILLED_SYRINGE | INTRAVENOUS | Status: DC | PRN
  Administered 2020-03-12: 8 ug via INTRAVENOUS
  Administered 2020-03-12: 4 ug via INTRAVENOUS

## 2020-03-12 MED ORDER — ORAL CARE MOUTH RINSE: 15.0000 mL | Freq: Once | OROMUCOSAL | Status: AC

## 2020-03-12 MED ORDER — BUPIVACAINE-EPINEPHRINE 0.25% -1:200000 IJ SOLN
INTRAMUSCULAR | Status: DC | PRN
  Administered 2020-03-12: 10 mL

## 2020-03-12 MED ORDER — PROPOFOL 10 MG/ML IV BOLUS
INTRAVENOUS | Status: AC
  Filled 2020-03-12: qty 20

## 2020-03-12 MED ORDER — HYDROCODONE-ACETAMINOPHEN 5-325 MG PO TABS
1.0000 | ORAL_TABLET | Freq: Once | ORAL | Status: AC
  Administered 2020-03-12: 1 via ORAL

## 2020-03-12 MED ORDER — ROCURONIUM BROMIDE 10 MG/ML (PF) SYRINGE
PREFILLED_SYRINGE | INTRAVENOUS | Status: AC
  Filled 2020-03-12: qty 10

## 2020-03-12 MED ORDER — ROCURONIUM BROMIDE 100 MG/10ML IV SOLN
INTRAVENOUS | Status: DC | PRN
  Administered 2020-03-12: 20 mg via INTRAVENOUS
  Administered 2020-03-12: 50 mg via INTRAVENOUS

## 2020-03-12 MED ORDER — CHLORHEXIDINE GLUCONATE 0.12 % MT SOLN
OROMUCOSAL | Status: AC
  Administered 2020-03-12: 15 mL via OROMUCOSAL
  Filled 2020-03-12: qty 15

## 2020-03-12 MED ORDER — ONDANSETRON HCL 4 MG/2ML IJ SOLN
INTRAMUSCULAR | Status: AC
  Filled 2020-03-12: qty 2

## 2020-03-12 MED ORDER — HYDROCODONE-ACETAMINOPHEN 5-325 MG PO TABS
ORAL_TABLET | ORAL | Status: AC
  Filled 2020-03-12: qty 1

## 2020-03-12 MED ORDER — ONDANSETRON HCL 4 MG/2ML IJ SOLN
INTRAMUSCULAR | Status: DC | PRN
  Administered 2020-03-12: 4 mg via INTRAVENOUS

## 2020-03-12 MED ORDER — SODIUM CHLORIDE FLUSH 0.9 % IV SOLN
INTRAVENOUS | Status: AC
  Filled 2020-03-12: qty 10

## 2020-03-12 MED ORDER — FENTANYL CITRATE (PF) 100 MCG/2ML IJ SOLN
INTRAMUSCULAR | Status: AC
  Filled 2020-03-12: qty 2

## 2020-03-12 MED ORDER — MIDAZOLAM HCL 2 MG/2ML IJ SOLN
INTRAMUSCULAR | Status: DC | PRN
  Administered 2020-03-12: 2 mg via INTRAVENOUS

## 2020-03-12 MED ORDER — ONDANSETRON HCL 4 MG/2ML IJ SOLN
4.0000 mg | Freq: Once | INTRAMUSCULAR | Status: AC | PRN
  Administered 2020-03-12: 4 mg via INTRAVENOUS

## 2020-03-12 MED ORDER — LIDOCAINE HCL (PF) 2 % IJ SOLN
INTRAMUSCULAR | Status: AC
  Filled 2020-03-12: qty 5

## 2020-03-12 MED ORDER — HYDROCODONE-ACETAMINOPHEN 5-325 MG PO TABS: 1.0000 | ORAL_TABLET | ORAL | 0 refills | Status: AC | PRN

## 2020-03-12 MED ORDER — LACTATED RINGERS IV SOLN
INTRAVENOUS | Status: DC
  Administered 2020-03-12: 10 mL/h via INTRAVENOUS

## 2020-03-12 MED ORDER — INDOCYANINE GREEN 25 MG IV SOLR
1.2500 mg | Freq: Once | INTRAVENOUS | Status: AC
  Administered 2020-03-12: 1.25 mg via INTRAVENOUS
  Filled 2020-03-12: qty 0.5

## 2020-03-12 MED ORDER — KETOROLAC TROMETHAMINE 30 MG/ML IJ SOLN
INTRAMUSCULAR | Status: DC | PRN
  Administered 2020-03-12: 30 mg via INTRAVENOUS

## 2020-03-12 MED ORDER — BUPIVACAINE-EPINEPHRINE (PF) 0.25% -1:200000 IJ SOLN
INTRAMUSCULAR | Status: AC
  Filled 2020-03-12: qty 30

## 2020-03-12 MED ORDER — DEXAMETHASONE SODIUM PHOSPHATE 10 MG/ML IJ SOLN
INTRAMUSCULAR | Status: AC
  Filled 2020-03-12: qty 1

## 2020-03-12 MED ORDER — ACETAMINOPHEN 10 MG/ML IV SOLN
INTRAVENOUS | Status: DC | PRN
  Administered 2020-03-12: 1000 mg via INTRAVENOUS

## 2020-03-12 MED ORDER — CHLORHEXIDINE GLUCONATE 0.12 % MT SOLN: 15.0000 mL | Freq: Once | OROMUCOSAL | Status: AC

## 2020-03-12 MED ORDER — PROPOFOL 10 MG/ML IV BOLUS
INTRAVENOUS | Status: DC | PRN
  Administered 2020-03-12: 140 mg via INTRAVENOUS

## 2020-03-12 MED ORDER — CEFAZOLIN SODIUM-DEXTROSE 2-4 GM/100ML-% IV SOLN
INTRAVENOUS | Status: AC
  Filled 2020-03-12: qty 100

## 2020-03-12 SURGICAL SUPPLY — 51 items
BAG INFUSER PRESSURE 100CC (MISCELLANEOUS) IMPLANT
BLADE SURG SZ11 CARB STEEL (BLADE) ×2 IMPLANT
CANNULA REDUC XI 12-8 STAPL (CANNULA) ×1
CANNULA REDUCER 12-8 DVNC XI (CANNULA) ×1 IMPLANT
CHLORAPREP W/TINT 26 (MISCELLANEOUS) ×2 IMPLANT
CLIP VESOLOCK MED LG 6/CT (CLIP) ×2 IMPLANT
COVER WAND RF STERILE (DRAPES) ×2 IMPLANT
DECANTER SPIKE VIAL GLASS SM (MISCELLANEOUS) ×4 IMPLANT
DEFOGGER SCOPE WARMER CLEARIFY (MISCELLANEOUS) ×2 IMPLANT
DERMABOND ADVANCED (GAUZE/BANDAGES/DRESSINGS) ×1
DERMABOND ADVANCED .7 DNX12 (GAUZE/BANDAGES/DRESSINGS) ×1 IMPLANT
DRAPE ARM DVNC X/XI (DISPOSABLE) ×4 IMPLANT
DRAPE COLUMN DVNC XI (DISPOSABLE) ×1 IMPLANT
DRAPE DA VINCI XI ARM (DISPOSABLE) ×4
DRAPE DA VINCI XI COLUMN (DISPOSABLE) ×1
ELECT REM PT RETURN 9FT ADLT (ELECTROSURGICAL) ×2
ELECTRODE REM PT RTRN 9FT ADLT (ELECTROSURGICAL) ×1 IMPLANT
GLOVE BIO SURGEON STRL SZ 6.5 (GLOVE) ×4 IMPLANT
GLOVE BIOGEL PI IND STRL 6.5 (GLOVE) ×2 IMPLANT
GLOVE BIOGEL PI INDICATOR 6.5 (GLOVE) ×2
GOWN STRL REUS W/ TWL LRG LVL3 (GOWN DISPOSABLE) ×3 IMPLANT
GOWN STRL REUS W/TWL LRG LVL3 (GOWN DISPOSABLE) ×3
GRASPER SUT TROCAR 14GX15 (MISCELLANEOUS) IMPLANT
IRRIGATOR SUCT 8 DISP DVNC XI (IRRIGATION / IRRIGATOR) IMPLANT
IRRIGATOR SUCTION 8MM XI DISP (IRRIGATION / IRRIGATOR)
IV NS 1000ML (IV SOLUTION)
IV NS 1000ML BAXH (IV SOLUTION) IMPLANT
KIT PINK PAD W/HEAD ARE REST (MISCELLANEOUS) ×2
KIT PINK PAD W/HEAD ARM REST (MISCELLANEOUS) ×1 IMPLANT
LABEL OR SOLS (LABEL) ×2 IMPLANT
MANIFOLD NEPTUNE II (INSTRUMENTS) IMPLANT
NEEDLE HYPO 22GX1.5 SAFETY (NEEDLE) ×2 IMPLANT
NEEDLE INSUFFLATION 14GA 120MM (NEEDLE) ×2 IMPLANT
NS IRRIG 500ML POUR BTL (IV SOLUTION) ×2 IMPLANT
OBTURATOR OPTICAL STANDARD 8MM (TROCAR) ×1
OBTURATOR OPTICAL STND 8 DVNC (TROCAR) ×1
OBTURATOR OPTICALSTD 8 DVNC (TROCAR) ×1 IMPLANT
PACK LAP CHOLECYSTECTOMY (MISCELLANEOUS) ×2 IMPLANT
POUCH SPECIMEN RETRIEVAL 10MM (ENDOMECHANICALS) ×2 IMPLANT
SEAL CANN UNIV 5-8 DVNC XI (MISCELLANEOUS) ×3 IMPLANT
SEAL XI 5MM-8MM UNIVERSAL (MISCELLANEOUS) ×3
SET TUBE SMOKE EVAC HIGH FLOW (TUBING) ×2 IMPLANT
SOLUTION ELECTROLUBE (MISCELLANEOUS) ×2 IMPLANT
STAPLER CANNULA SEAL DVNC XI (STAPLE) ×1 IMPLANT
STAPLER CANNULA SEAL XI (STAPLE) ×1
SUT MNCRL 4-0 (SUTURE) ×1
SUT MNCRL 4-0 27XMFL (SUTURE) ×1
SUT VIC AB 3-0 SH 27 (SUTURE)
SUT VIC AB 3-0 SH 27X BRD (SUTURE) IMPLANT
SUT VICRYL 0 AB UR-6 (SUTURE) IMPLANT
SUTURE MNCRL 4-0 27XMF (SUTURE) ×1 IMPLANT

## 2020-03-12 NOTE — Anesthesia Postprocedure Evaluation (Signed)
Anesthesia Post Note  Patient: Andrea Baird  Procedure(s) Performed: XI ROBOTIC ASSISTED LAPAROSCOPIC CHOLECYSTECTOMY (N/A Abdomen)  Patient location during evaluation: PACU Anesthesia Type: General Level of consciousness: awake and alert Pain management: pain level controlled Vital Signs Assessment: post-procedure vital signs reviewed and stable Respiratory status: spontaneous breathing and respiratory function stable Cardiovascular status: stable Anesthetic complications: no   No complications documented.   Last Vitals:  Vitals:   03/12/20 1956 03/12/20 1958  BP:  126/88  Pulse: 71 69  Resp:    Temp:    SpO2: 100% 99%    Last Pain:  Vitals:   03/12/20 1954  TempSrc:   PainSc: Asleep                 Khup Sapia K

## 2020-03-12 NOTE — Anesthesia Procedure Notes (Signed)
Procedure Name: Intubation Date/Time: 03/12/2020 6:25 PM Performed by: Joanette Gula, Akon Reinoso, CRNA Pre-anesthesia Checklist: Patient identified, Emergency Drugs available, Suction available and Patient being monitored Patient Re-evaluated:Patient Re-evaluated prior to induction Oxygen Delivery Method: Circle system utilized Preoxygenation: Pre-oxygenation with 100% oxygen Induction Type: IV induction Ventilation: Mask ventilation without difficulty Laryngoscope Size: 3 and McGraph Grade View: Grade I Tube type: Oral Tube size: 7.0 mm Number of attempts: 1 Airway Equipment and Method: Stylet Placement Confirmation: ETT inserted through vocal cords under direct vision,  positive ETCO2 and breath sounds checked- equal and bilateral Secured at: 21 cm Tube secured with: Tape Dental Injury: Teeth and Oropharynx as per pre-operative assessment

## 2020-03-12 NOTE — Anesthesia Preprocedure Evaluation (Signed)
Anesthesia Evaluation  Patient identified by MRN, date of birth, ID band Patient awake    Reviewed: Allergy & Precautions, NPO status , Patient's Chart, lab work & pertinent test results  History of Anesthesia Complications (+) PONV and history of anesthetic complications  Airway Mallampati: II       Dental   Pulmonary neg sleep apnea, neg COPD, Not current smoker,           Cardiovascular (-) hypertension(-) Past MI and (-) CHF (-) dysrhythmias (-) Valvular Problems/Murmurs     Neuro/Psych neg Seizures    GI/Hepatic Neg liver ROS, GERD  Medicated and Controlled,  Endo/Other  neg diabetes  Renal/GU negative Renal ROS     Musculoskeletal   Abdominal   Peds  Hematology   Anesthesia Other Findings   Reproductive/Obstetrics                             Anesthesia Physical Anesthesia Plan  ASA: II  Anesthesia Plan: General   Post-op Pain Management:    Induction: Intravenous  PONV Risk Score and Plan: 4 or greater and Ondansetron, Dexamethasone, Midazolam and Treatment may vary due to age or medical condition  Airway Management Planned: Oral ETT  Additional Equipment:   Intra-op Plan:   Post-operative Plan:   Informed Consent: I have reviewed the patients History and Physical, chart, labs and discussed the procedure including the risks, benefits and alternatives for the proposed anesthesia with the patient or authorized representative who has indicated his/her understanding and acceptance.       Plan Discussed with:   Anesthesia Plan Comments:         Anesthesia Quick Evaluation

## 2020-03-12 NOTE — Transfer of Care (Signed)
Immediate Anesthesia Transfer of Care Note  Patient: Andrea Baird  Procedure(s) Performed: XI ROBOTIC ASSISTED LAPAROSCOPIC CHOLECYSTECTOMY (N/A Abdomen)  Patient Location: PACU  Anesthesia Type:General  Level of Consciousness: awake, alert  and oriented  Airway & Oxygen Therapy: Patient Spontanous Breathing and Patient connected to face mask oxygen  Post-op Assessment: Report given to RN and Post -op Vital signs reviewed and stable  Post vital signs: Reviewed and stable  Last Vitals:  Vitals Value Taken Time  BP 146/88 03/12/20 1927  Temp    Pulse 78 03/12/20 1930  Resp 13 03/12/20 1930  SpO2 100 % 03/12/20 1930  Vitals shown include unvalidated device data.  Last Pain:  Vitals:   03/12/20 1610  TempSrc: Tympanic  PainSc: 6       Patients Stated Pain Goal: 1 (03/12/20 1610)  Complications: No complications documented.

## 2020-03-12 NOTE — Discharge Instructions (Signed)

## 2020-03-12 NOTE — Op Note (Addendum)
Preoperative diagnosis: Biliary Colic  Postoperative diagnosis: Biliary Colic  Procedure: Robotic Assisted Laparoscopic Cholecystectomy.   Anesthesia: GETA   Surgeon: Dr. Hazle Quant  Wound Classification: Clean Contaminated  Indications: Patient is a 50 y.o. female developed right upper quadrant pain, nausea, vomiting and on workup was found to have gallbladder with a normal common duct. Due to patient right upper quadrant pain exacerbated by oral intake, with otherwise normal workup, biliary colic from a functional issue was assessed and Robotic Assisted Laparoscopic cholecystectomy was elected.  Findings: Critical view of safety achieved Cystic duct and artery identified, ligated and divided Adequate hemostasis  Description of procedure: The patient was placed on the operating table in the supine position. General anesthesia was induced. A time-out was completed verifying correct patient, procedure, site, positioning, and implant(s) and/or special equipment prior to beginning this procedure. An orogastric tube was placed. The abdomen was prepped and draped in the usual sterile fashion.  An incision was made in a natural skin line below the umbilicus.  The fascia was elevated and the Veress needle inserted. Proper position was confirmed by aspiration and saline meniscus test.  The abdomen was insufflated with carbon dioxide to a pressure of 15 mmHg. The patient tolerated insufflation well. A 8-mm trocar was then inserted in optiview fashion.  The laparoscope was inserted and the abdomen inspected. No injuries from initial trocar placement were noted. Additional trocars were then inserted in the following locations: an 8-mm trocar in the left lateral abdomen, and another two 8-mm trocars to the right side of the abdomen 5 cm appart. The umbilical trocar was changed to a 12 mm trocar all under direct visualization. The abdomen was inspected and no abnormalities were found. The table was  placed in the reverse Trendelenburg position with the right side up. The robotic arms were docked and target anatomy identified. Instrument inserted under direct visualization.  Filmy adhesions between the gallbladder and omentum, duodenum and transverse colon were lysed with electrocautery. The dome of the gallbladder was grasped with a prograsp and retracted over the dome of the liver. The infundibulum was also grasped with an atraumatic grasper and retracted toward the right lower quadrant. This maneuver exposed Calot's triangle. The peritoneum overlying the gallbladder infundibulum was then incised and the cystic duct and cystic artery identified and circumferentially dissected. Critical view of safety reviewed before ligating any structure. Firefly images taken to visualize biliary ducts. The cystic duct and cystic artery were then doubly clipped and divided close to the gallbladder.  The gallbladder was then dissected from its peritoneal attachments by electrocautery. Hemostasis was checked and the gallbladder and contained stones were removed using an endoscopic retrieval bag. The gallbladder was passed off the table as a specimen. The gallbladder fossa was copiously irrigated with saline and hemostasis was obtained. There was no evidence of bleeding from the gallbladder fossa or cystic artery or leakage of the bile from the cystic duct stump. Secondary trocars were removed under direct vision. No bleeding was noted. The robotic arms were undoked. The scope was withdrawn and the umbilical trocar removed. The abdomen was allowed to collapse. The fascia of the 90mm trocar sites was closed with figure-of-eight 0 vicryl sutures. The skin was closed with subcuticular sutures of 4-0 monocryl and topical skin adhesive. The orogastric tube was removed.  The patient tolerated the procedure well and was taken to the postanesthesia care unit in stable condition.   Specimen: Gallbladder  Complications:  None  EBL: 5 mL

## 2020-03-12 NOTE — Interval H&P Note (Signed)
History and Physical Interval Note:  03/12/2020 4:27 PM  Andrea Baird  has presented today for surgery, with the diagnosis of abdominal pain.  The various methods of treatment have been discussed with the patient and family. After consideration of risks, benefits and other options for treatment, the patient has consented to  Procedure(s): XI ROBOTIC ASSISTED LAPAROSCOPIC CHOLECYSTECTOMY (N/A) as a surgical intervention.  The patient's history has been reviewed, patient examined, no change in status, stable for surgery.  I have reviewed the patient's chart and labs.  Questions were answered to the patient's satisfaction.     Carolan Shiver

## 2020-03-13 ENCOUNTER — Ambulatory Visit: Admission: RE | Admit: 2020-03-13 | Payer: 59 | Source: Ambulatory Visit

## 2020-03-14 LAB — SURGICAL PATHOLOGY

## 2020-05-10 DIAGNOSIS — G479 Sleep disorder, unspecified: Secondary | ICD-10-CM | POA: Insufficient documentation

## 2020-05-11 ENCOUNTER — Other Ambulatory Visit (HOSPITAL_COMMUNITY): Payer: Self-pay | Admitting: Neurology

## 2020-05-11 ENCOUNTER — Other Ambulatory Visit: Payer: Self-pay | Admitting: Neurology

## 2020-05-11 DIAGNOSIS — R519 Headache, unspecified: Secondary | ICD-10-CM

## 2020-05-25 ENCOUNTER — Ambulatory Visit
Admission: RE | Admit: 2020-05-25 | Discharge: 2020-05-25 | Disposition: A | Discharge status: Home / Self Care | Payer: 59 | Source: Ambulatory Visit | Attending: Neurology | Admitting: Neurology

## 2020-05-25 ENCOUNTER — Other Ambulatory Visit: Payer: Self-pay

## 2020-05-25 DIAGNOSIS — R519 Headache, unspecified: Secondary | ICD-10-CM | POA: Insufficient documentation

## 2020-05-25 MED ORDER — GADOBUTROL 1 MMOL/ML IV SOLN
10.0000 mL | Freq: Once | INTRAVENOUS | Status: AC | PRN
  Administered 2020-05-25: 10 mL via INTRAVENOUS

## 2021-05-31 ENCOUNTER — Other Ambulatory Visit
Admission: RE | Admit: 2021-05-31 | Discharge: 2021-05-31 | Disposition: A | Discharge status: Home / Self Care | Payer: Self-pay | Source: Ambulatory Visit | Attending: Pediatrics | Admitting: Pediatrics

## 2021-05-31 DIAGNOSIS — R0789 Other chest pain: Secondary | ICD-10-CM | POA: Diagnosis not present

## 2021-05-31 DIAGNOSIS — J029 Acute pharyngitis, unspecified: Secondary | ICD-10-CM | POA: Diagnosis not present

## 2021-05-31 DIAGNOSIS — J40 Bronchitis, not specified as acute or chronic: Secondary | ICD-10-CM | POA: Diagnosis not present

## 2021-05-31 DIAGNOSIS — R051 Acute cough: Secondary | ICD-10-CM | POA: Diagnosis not present

## 2021-05-31 DIAGNOSIS — Z03818 Encounter for observation for suspected exposure to other biological agents ruled out: Secondary | ICD-10-CM | POA: Diagnosis not present

## 2021-05-31 DIAGNOSIS — R079 Chest pain, unspecified: Secondary | ICD-10-CM | POA: Diagnosis not present

## 2021-05-31 DIAGNOSIS — R059 Cough, unspecified: Secondary | ICD-10-CM | POA: Diagnosis not present

## 2021-05-31 LAB — D-DIMER, QUANTITATIVE: D-Dimer, Quant: <0.27 ug/mL-FEU (ref 0.00–0.50)

## 2021-05-31 LAB — TROPONIN I (HIGH SENSITIVITY): Troponin I (High Sensitivity): 3 ng/L (ref <18)

## 2021-06-04 DIAGNOSIS — R0789 Other chest pain: Secondary | ICD-10-CM | POA: Diagnosis not present

## 2021-06-04 DIAGNOSIS — Z6837 Body mass index (BMI) 37.0-37.9, adult: Secondary | ICD-10-CM | POA: Diagnosis not present

## 2021-06-04 DIAGNOSIS — E7800 Pure hypercholesterolemia, unspecified: Secondary | ICD-10-CM | POA: Diagnosis not present

## 2021-06-17 DIAGNOSIS — E7800 Pure hypercholesterolemia, unspecified: Secondary | ICD-10-CM | POA: Diagnosis not present

## 2021-06-21 DIAGNOSIS — I10 Essential (primary) hypertension: Secondary | ICD-10-CM | POA: Insufficient documentation

## 2021-06-21 DIAGNOSIS — E7800 Pure hypercholesterolemia, unspecified: Secondary | ICD-10-CM | POA: Diagnosis not present

## 2021-06-21 DIAGNOSIS — R0789 Other chest pain: Secondary | ICD-10-CM | POA: Diagnosis not present

## 2021-07-15 DIAGNOSIS — R079 Chest pain, unspecified: Secondary | ICD-10-CM | POA: Diagnosis not present

## 2021-07-22 DIAGNOSIS — I10 Essential (primary) hypertension: Secondary | ICD-10-CM | POA: Diagnosis not present

## 2021-07-22 DIAGNOSIS — E7800 Pure hypercholesterolemia, unspecified: Secondary | ICD-10-CM | POA: Diagnosis not present

## 2021-07-29 DIAGNOSIS — M7751 Other enthesopathy of right foot: Secondary | ICD-10-CM | POA: Diagnosis not present

## 2021-08-12 DIAGNOSIS — M722 Plantar fascial fibromatosis: Secondary | ICD-10-CM | POA: Diagnosis not present

## 2021-08-12 DIAGNOSIS — M216X1 Other acquired deformities of right foot: Secondary | ICD-10-CM | POA: Diagnosis not present

## 2021-08-12 DIAGNOSIS — E669 Obesity, unspecified: Secondary | ICD-10-CM | POA: Diagnosis not present

## 2021-08-12 DIAGNOSIS — M79671 Pain in right foot: Secondary | ICD-10-CM | POA: Diagnosis not present

## 2021-08-12 DIAGNOSIS — M216X2 Other acquired deformities of left foot: Secondary | ICD-10-CM | POA: Diagnosis not present

## 2021-08-12 DIAGNOSIS — M7731 Calcaneal spur, right foot: Secondary | ICD-10-CM | POA: Diagnosis not present

## 2021-09-02 DIAGNOSIS — M7661 Achilles tendinitis, right leg: Secondary | ICD-10-CM | POA: Diagnosis not present

## 2021-09-02 DIAGNOSIS — M216X2 Other acquired deformities of left foot: Secondary | ICD-10-CM | POA: Diagnosis not present

## 2021-09-02 DIAGNOSIS — M7731 Calcaneal spur, right foot: Secondary | ICD-10-CM | POA: Diagnosis not present

## 2021-09-02 DIAGNOSIS — M79671 Pain in right foot: Secondary | ICD-10-CM | POA: Diagnosis not present

## 2021-09-02 DIAGNOSIS — M9261 Juvenile osteochondrosis of tarsus, right ankle: Secondary | ICD-10-CM | POA: Diagnosis not present

## 2021-09-02 DIAGNOSIS — M216X1 Other acquired deformities of right foot: Secondary | ICD-10-CM | POA: Diagnosis not present

## 2021-09-02 DIAGNOSIS — E669 Obesity, unspecified: Secondary | ICD-10-CM | POA: Diagnosis not present

## 2021-10-03 DIAGNOSIS — R0681 Apnea, not elsewhere classified: Secondary | ICD-10-CM | POA: Diagnosis not present

## 2021-10-23 DIAGNOSIS — E669 Obesity, unspecified: Secondary | ICD-10-CM | POA: Diagnosis not present

## 2021-10-23 DIAGNOSIS — E7800 Pure hypercholesterolemia, unspecified: Secondary | ICD-10-CM | POA: Diagnosis not present

## 2021-10-23 DIAGNOSIS — R079 Chest pain, unspecified: Secondary | ICD-10-CM | POA: Diagnosis not present

## 2021-10-23 DIAGNOSIS — Z8249 Family history of ischemic heart disease and other diseases of the circulatory system: Secondary | ICD-10-CM | POA: Diagnosis not present

## 2021-10-23 DIAGNOSIS — I10 Essential (primary) hypertension: Secondary | ICD-10-CM | POA: Diagnosis not present

## 2021-10-24 ENCOUNTER — Other Ambulatory Visit: Payer: Self-pay | Admitting: Internal Medicine

## 2021-10-24 DIAGNOSIS — R079 Chest pain, unspecified: Secondary | ICD-10-CM

## 2021-10-30 ENCOUNTER — Telehealth (HOSPITAL_COMMUNITY): Payer: Self-pay | Admitting: Emergency Medicine

## 2021-10-30 DIAGNOSIS — R079 Chest pain, unspecified: Secondary | ICD-10-CM

## 2021-10-30 MED ORDER — METOPROLOL TARTRATE 100 MG PO TABS: 100.0000 mg | ORAL_TABLET | Freq: Once | ORAL | 0 refills | Status: AC

## 2021-10-30 MED ORDER — IVABRADINE HCL 5 MG PO TABS: 15.0000 mg | ORAL_TABLET | Freq: Once | ORAL | 0 refills | Status: AC

## 2021-10-30 NOTE — Telephone Encounter (Signed)
Reaching out to patient to offer assistance regarding upcoming cardiac imaging study; pt verbalizes understanding of appt date/time, parking situation and where to check in, pre-test NPO status and medications ordered, and verified current allergies; name and call back number provided for further questions should they arise Rockwell Alexandria RN Navigator Cardiac Imaging [facility] [phone] office [phone] cell  100mg  metop + 15mg  ivabradine Difficult iv  Arrival 1000

## 2021-10-31 ENCOUNTER — Ambulatory Visit
Admission: RE | Admit: 2021-10-31 | Discharge: 2021-10-31 | Disposition: A | Discharge status: Home / Self Care | Payer: 59 | Source: Ambulatory Visit | Attending: Internal Medicine | Admitting: Internal Medicine

## 2021-10-31 DIAGNOSIS — R079 Chest pain, unspecified: Secondary | ICD-10-CM | POA: Diagnosis not present

## 2021-10-31 HISTORY — DX: Essential (primary) hypertension: I10

## 2021-10-31 MED ORDER — IOHEXOL 350 MG/ML SOLN
100.0000 mL | Freq: Once | INTRAVENOUS | Status: AC | PRN
  Administered 2021-10-31: 100 mL via INTRAVENOUS

## 2021-10-31 MED ORDER — NITROGLYCERIN 0.4 MG SL SUBL
0.8000 mg | SUBLINGUAL_TABLET | Freq: Once | SUBLINGUAL | Status: AC
  Administered 2021-10-31: 0.8 mg via SUBLINGUAL

## 2021-10-31 NOTE — Progress Notes (Signed)
Patient tolerated procedure well. Ambulate w/o difficulty. Denies light headedness or being dizzy. Sitting in chair drinking water provided. Encouraged to drink extra water today and reasoning explained. Verbalized understanding. All questions answered. ABC intact. No further needs. Discharge from procedure area w/o issues.   °

## 2021-11-19 DIAGNOSIS — I10 Essential (primary) hypertension: Secondary | ICD-10-CM | POA: Diagnosis not present

## 2021-11-19 DIAGNOSIS — E7800 Pure hypercholesterolemia, unspecified: Secondary | ICD-10-CM | POA: Diagnosis not present

## 2021-11-19 DIAGNOSIS — R0602 Shortness of breath: Secondary | ICD-10-CM | POA: Diagnosis not present

## 2021-11-19 DIAGNOSIS — R0789 Other chest pain: Secondary | ICD-10-CM | POA: Diagnosis not present

## 2021-11-19 DIAGNOSIS — R002 Palpitations: Secondary | ICD-10-CM | POA: Diagnosis not present

## 2021-12-02 DIAGNOSIS — E7800 Pure hypercholesterolemia, unspecified: Secondary | ICD-10-CM | POA: Diagnosis not present

## 2021-12-09 ENCOUNTER — Other Ambulatory Visit: Payer: Self-pay | Admitting: Family Medicine

## 2021-12-09 DIAGNOSIS — Z6835 Body mass index (BMI) 35.0-35.9, adult: Secondary | ICD-10-CM | POA: Diagnosis not present

## 2021-12-09 DIAGNOSIS — Z1231 Encounter for screening mammogram for malignant neoplasm of breast: Secondary | ICD-10-CM

## 2021-12-09 DIAGNOSIS — I10 Essential (primary) hypertension: Secondary | ICD-10-CM | POA: Diagnosis not present

## 2021-12-09 DIAGNOSIS — E7800 Pure hypercholesterolemia, unspecified: Secondary | ICD-10-CM | POA: Diagnosis not present

## 2021-12-11 DIAGNOSIS — R002 Palpitations: Secondary | ICD-10-CM | POA: Diagnosis not present

## 2022-01-03 ENCOUNTER — Ambulatory Visit
Admission: RE | Admit: 2022-01-03 | Discharge: 2022-01-03 | Disposition: A | Discharge status: Home / Self Care | Payer: 59 | Source: Ambulatory Visit | Attending: Family Medicine | Admitting: Family Medicine

## 2022-01-03 DIAGNOSIS — Z1231 Encounter for screening mammogram for malignant neoplasm of breast: Secondary | ICD-10-CM | POA: Diagnosis not present

## 2022-01-15 ENCOUNTER — Ambulatory Visit: Payer: 59 | Admitting: Podiatry

## 2022-01-17 ENCOUNTER — Ambulatory Visit (INDEPENDENT_AMBULATORY_CARE_PROVIDER_SITE_OTHER): Payer: 59

## 2022-01-17 ENCOUNTER — Ambulatory Visit: Payer: 59 | Admitting: Podiatry

## 2022-01-17 ENCOUNTER — Encounter: Payer: Self-pay | Admitting: Podiatry

## 2022-01-17 DIAGNOSIS — M722 Plantar fascial fibromatosis: Secondary | ICD-10-CM | POA: Diagnosis not present

## 2022-01-17 DIAGNOSIS — M7661 Achilles tendinitis, right leg: Secondary | ICD-10-CM | POA: Diagnosis not present

## 2022-01-17 MED ORDER — METHYLPREDNISOLONE 4 MG PO TBPK: ORAL_TABLET | ORAL | 0 refills | Status: DC

## 2022-01-17 MED ORDER — MELOXICAM 15 MG PO TABS: 15.0000 mg | ORAL_TABLET | Freq: Every day | ORAL | 1 refills | Status: DC

## 2022-01-17 MED ORDER — BETAMETHASONE SOD PHOS & ACET 6 (3-3) MG/ML IJ SUSP
3.0000 mg | Freq: Once | INTRAMUSCULAR | Status: AC
  Administered 2022-01-17: 3 mg via INTRA_ARTICULAR

## 2022-01-17 NOTE — Progress Notes (Signed)
Chief Complaint  Patient presents with   Foot Pain    "I have a lump on the back of my heel on my right foot that is very painful.  I have a lump on the bottom of my left foot." N - heel and arch pain L - posterior heel pain rt,  arch left D - rt - 8 mos, lt - 5 mos O - gradually gotten worse C - burn - left, throbbing and aching night - feels like ripping - right A - walking T - tried Diclofenac cream, stretching, different shoes    HPI: 51 y.o. female presenting today as a new patient for evaluation of pain and tenderness associated to the right posterior heel this been going on about 8-9 months now.  Idiopathic onset.  She has been icing her foot and applying diclofenac topical to the posterior heel.  Patient also states that she has developed a symptomatic lump to the plantar aspect of the left foot.  Idiopathic onset.  It is minimally tender and minimally symptomatic.  Only aggravated with certain shoes  Past Medical History:  Diagnosis Date   Anemia    Chronic headaches 2018   Complication of anesthesia    sometimes wakes up with a bad headache and Nausa    Elevated cholesterol    GERD (gastroesophageal reflux disease)    Hypertension    Otosclerosis, right 2019   PONV (postoperative nausea and vomiting)     Past Surgical History:  Procedure Laterality Date   ABDOMINAL HYSTERECTOMY     COLONOSCOPY, ESOPHAGOGASTRODUODENOSCOPY (EGD) AND ESOPHAGEAL DILATION     ESOPHAGOGASTRODUODENOSCOPY (EGD) WITH PROPOFOL N/A 05/05/2017   Procedure: ESOPHAGOGASTRODUODENOSCOPY (EGD) WITH PROPOFOL;  Surgeon: Toledo, Boykin Nearing, MD;  Location: [facility];  Service: Gastroenterology;  Laterality: N/A;   HYSTERECTOMY ABDOMINAL WITH SALPINGECTOMY Bilateral 11/2015   LAPAROSCOPIC ABDOMINAL EXPLORATION Bilateral 1990   LAPAROTOMY     laproscopy     LYSIS OF ADHESION N/A 06/12/2017   Procedure: LYSIS OF ADHESION;  Surgeon: Christeen Douglas, MD;  Location: [facility];  Service: Gynecology;   Laterality: N/A;   TUBAL LIGATION      No Known Allergies   Physical Exam: General: The patient is alert and oriented x3 in no acute distress.  Dermatology: Skin is warm, dry and supple bilateral lower extremities. Negative for open lesions or macerations.  Vascular: Palpable pedal pulses bilaterally. No edema or erythema noted. Capillary refill within normal limits.  Neurological: Epicritic and protective threshold grossly intact bilaterally.   Musculoskeletal Exam: Pain on palpation noted to the posterior tubercle of the right calcaneus at the insertion of the Achilles tendon consistent with retrocalcaneal bursitis. Range of motion within normal limits. Muscle strength 5/5 in all muscle groups bilateral lower extremities.  Radiographic Exam B/L feet:  Posterior and plantar calcaneal spur noted to the respective calcaneus on lateral view. No fracture or dislocation noted. Normal osseous mineralization noted.     Assessment: 1. Insertional Achilles tendinitis right 2.  Posterior heel spur right 3.  Plantar fibroma left  Plan of Care:  1. Patient was evaluated. Radiographs were reviewed today. 2. Injection of 0.5 mL Celestone Soluspan injected into the retrocalcaneal bursa. Care was taken to avoid direct injection into the Achilles tendon. 3.  Prescription for Medrol Dosepak 4.  Prescription for meloxicam 50 mg daily 5.  Recommend light stretching exercises daily which were demonstrated today, specifically calf stretches 6.  Continue wearing good supportive shoes and sneakers. 7.  For now we are going to simply monitor the plantar fibroma to the left foot since it is minimally symptomatic 8.  Return to clinic 4 weeks  Felecia Shelling, DPM [facility]  Dr. Felecia Shelling, DPM    [address] [place], [place] [address]                Office [phone]  Fax [phone]

## 2022-02-04 DIAGNOSIS — R0681 Apnea, not elsewhere classified: Secondary | ICD-10-CM | POA: Diagnosis not present

## 2022-02-08 DIAGNOSIS — G4733 Obstructive sleep apnea (adult) (pediatric): Secondary | ICD-10-CM | POA: Diagnosis not present

## 2022-02-18 ENCOUNTER — Ambulatory Visit (INDEPENDENT_AMBULATORY_CARE_PROVIDER_SITE_OTHER): Payer: 59 | Admitting: Podiatry

## 2022-02-18 DIAGNOSIS — R079 Chest pain, unspecified: Secondary | ICD-10-CM | POA: Diagnosis not present

## 2022-02-18 DIAGNOSIS — Z91199 Patient's noncompliance with other medical treatment and regimen due to unspecified reason: Secondary | ICD-10-CM

## 2022-02-18 DIAGNOSIS — R0789 Other chest pain: Secondary | ICD-10-CM | POA: Diagnosis not present

## 2022-02-18 NOTE — Progress Notes (Signed)
   Complete physical exam  Patient: Andrea Baird   DOB: 12/28/1999   51 y.o. Female  MRN: [id number]  Subjective:    No chief complaint on file.   Andrea Baird is a 51 y.o. female who presents today for a complete physical exam. She reports consuming a {diet types:17450} diet. {types:19826} She generally feels {DESC; WELL/FAIRLY WELL/POORLY:18703}. She reports sleeping {DESC; WELL/FAIRLY WELL/POORLY:18703}. She {does/does not:200015} have additional problems to discuss today.    Most recent fall risk assessment:    09/04/2021   10:42 AM  Fall Risk   Falls in the past year? 0  Number falls in past yr: 0  Injury with Fall? 0  Risk for fall due to : No Fall Risks  Follow up Falls evaluation completed     Most recent depression screenings:    09/04/2021   10:42 AM 07/26/2020   10:46 AM  PHQ 2/9 Scores  PHQ - 2 Score 0 0  PHQ- 9 Score 5     {VISON DENTAL STD PSA (Optional):27386}  {History (Optional):23778}  Patient Care Team: Jessup, Joy, NP as PCP - General (Nurse Practitioner)   Outpatient Medications Prior to Visit  Medication Sig   fluticasone (FLONASE) 50 MCG/ACT nasal spray Place 2 sprays into both nostrils in the morning and at bedtime. After 7 days, reduce to once daily.   norgestimate-ethinyl estradiol (SPRINTEC 28) 0.25-35 MG-MCG tablet Take 1 tablet by mouth daily.   Nystatin POWD Apply liberally to affected area 2 times per day   spironolactone (ALDACTONE) 100 MG tablet Take 1 tablet (100 mg total) by mouth daily.   No facility-administered medications prior to visit.    ROS        Objective:     There were no vitals taken for this visit. {Vitals History (Optional):23777}  Physical Exam   No results found for any visits on 10/10/21. {Show previous labs (optional):23779}    Assessment & Plan:    Routine Health Maintenance and Physical Exam  Immunization History  Administered Date(s) Administered   DTaP 03/13/1999, 05/09/1999,  07/18/1999, 04/02/2000, 10/17/2003   Hepatitis A 08/13/2007, 08/18/2008   Hepatitis B 12/29/1998, 02/05/1999, 07/18/1999   HiB (PRP-OMP) 03/13/1999, 05/09/1999, 07/18/1999, 04/02/2000   IPV 03/13/1999, 05/09/1999, 01/06/2000, 10/17/2003   Influenza,inj,Quad PF,6+ Mos 11/18/2013   Influenza-Unspecified 02/18/2012   MMR 01/05/2001, 10/17/2003   Meningococcal Polysaccharide 08/18/2011   Pneumococcal Conjugate-13 04/02/2000   Pneumococcal-Unspecified 07/18/1999, 10/01/1999   Tdap 08/18/2011   Varicella 01/06/2000, 08/13/2007    Health Maintenance  Topic Date Due   HIV Screening  Never done   Hepatitis C Screening  Never done   INFLUENZA VACCINE  10/08/2021   PAP-Cervical Cytology Screening  10/10/2021 (Originally 12/28/2019)   PAP SMEAR-Modifier  10/10/2021 (Originally 12/28/2019)   TETANUS/TDAP  10/10/2021 (Originally 08/17/2021)   HPV VACCINES  Discontinued   COVID-19 Vaccine  Discontinued    Discussed health benefits of physical activity, and encouraged her to engage in regular exercise appropriate for her age and condition.  Problem List Items Addressed This Visit   None Visit Diagnoses     Annual physical exam    -  Primary   Cervical cancer screening       Need for Tdap vaccination          No follow-ups on file.     Joy Jessup, NP   

## 2022-02-24 DIAGNOSIS — G4733 Obstructive sleep apnea (adult) (pediatric): Secondary | ICD-10-CM | POA: Insufficient documentation

## 2022-03-30 DIAGNOSIS — R399 Unspecified symptoms and signs involving the genitourinary system: Secondary | ICD-10-CM | POA: Diagnosis not present

## 2022-04-01 DIAGNOSIS — G4733 Obstructive sleep apnea (adult) (pediatric): Secondary | ICD-10-CM | POA: Diagnosis not present

## 2022-04-23 DIAGNOSIS — G4733 Obstructive sleep apnea (adult) (pediatric): Secondary | ICD-10-CM | POA: Diagnosis not present

## 2022-04-29 DIAGNOSIS — R399 Unspecified symptoms and signs involving the genitourinary system: Secondary | ICD-10-CM | POA: Diagnosis not present

## 2022-04-30 DIAGNOSIS — R197 Diarrhea, unspecified: Secondary | ICD-10-CM | POA: Diagnosis not present

## 2022-04-30 DIAGNOSIS — R110 Nausea: Secondary | ICD-10-CM | POA: Diagnosis not present

## 2022-04-30 DIAGNOSIS — R300 Dysuria: Secondary | ICD-10-CM | POA: Diagnosis not present

## 2022-05-02 DIAGNOSIS — G4733 Obstructive sleep apnea (adult) (pediatric): Secondary | ICD-10-CM | POA: Diagnosis not present

## 2022-06-18 ENCOUNTER — Encounter: Payer: Self-pay | Admitting: Gastroenterology

## 2022-06-19 ENCOUNTER — Encounter: Payer: Self-pay | Admitting: Gastroenterology

## 2022-06-19 ENCOUNTER — Ambulatory Visit
Admission: RE | Admit: 2022-06-19 | Discharge: 2022-06-19 | Disposition: A | Discharge status: Home / Self Care | Payer: 59 | Attending: Gastroenterology | Admitting: Gastroenterology

## 2022-06-19 ENCOUNTER — Encounter
Admission: RE | Disposition: A | Discharge status: Home / Self Care | Payer: Self-pay | Source: Home / Self Care | Attending: Gastroenterology

## 2022-06-19 ENCOUNTER — Ambulatory Visit: Payer: 59 | Admitting: Registered Nurse

## 2022-06-19 DIAGNOSIS — K219 Gastro-esophageal reflux disease without esophagitis: Secondary | ICD-10-CM | POA: Insufficient documentation

## 2022-06-19 DIAGNOSIS — K224 Dyskinesia of esophagus: Secondary | ICD-10-CM | POA: Insufficient documentation

## 2022-06-19 DIAGNOSIS — Z9049 Acquired absence of other specified parts of digestive tract: Secondary | ICD-10-CM | POA: Insufficient documentation

## 2022-06-19 DIAGNOSIS — I10 Essential (primary) hypertension: Secondary | ICD-10-CM | POA: Insufficient documentation

## 2022-06-19 DIAGNOSIS — K222 Esophageal obstruction: Secondary | ICD-10-CM | POA: Diagnosis not present

## 2022-06-19 DIAGNOSIS — E7800 Pure hypercholesterolemia, unspecified: Secondary | ICD-10-CM | POA: Diagnosis not present

## 2022-06-19 DIAGNOSIS — R1310 Dysphagia, unspecified: Secondary | ICD-10-CM | POA: Diagnosis present

## 2022-06-19 HISTORY — PX: ESOPHAGOGASTRODUODENOSCOPY: SHX5428

## 2022-06-19 SURGERY — EGD (ESOPHAGOGASTRODUODENOSCOPY)
Anesthesia: General

## 2022-06-19 MED ORDER — GLYCOPYRROLATE 0.2 MG/ML IJ SOLN
INTRAMUSCULAR | Status: DC | PRN
  Administered 2022-06-19: .2 mg via INTRAVENOUS

## 2022-06-19 MED ORDER — ONDANSETRON HCL 4 MG/2ML IJ SOLN
INTRAMUSCULAR | Status: AC
  Filled 2022-06-19: qty 2

## 2022-06-19 MED ORDER — LIDOCAINE HCL (PF) 2 % IJ SOLN
INTRAMUSCULAR | Status: AC
  Filled 2022-06-19: qty 5

## 2022-06-19 MED ORDER — PROPOFOL 1000 MG/100ML IV EMUL
INTRAVENOUS | Status: AC
  Filled 2022-06-19: qty 100

## 2022-06-19 MED ORDER — GLYCOPYRROLATE 0.2 MG/ML IJ SOLN
INTRAMUSCULAR | Status: AC
  Filled 2022-06-19: qty 1

## 2022-06-19 MED ORDER — ONDANSETRON HCL 4 MG/2ML IJ SOLN
INTRAMUSCULAR | Status: DC | PRN
  Administered 2022-06-19: 4 mg via INTRAVENOUS

## 2022-06-19 MED ORDER — LIDOCAINE HCL (CARDIAC) PF 100 MG/5ML IV SOSY
PREFILLED_SYRINGE | INTRAVENOUS | Status: DC | PRN
  Administered 2022-06-19: 100 mg via INTRAVENOUS

## 2022-06-19 MED ORDER — SODIUM CHLORIDE 0.9 % IV SOLN: INTRAVENOUS | Status: DC

## 2022-06-19 MED ORDER — PROPOFOL 10 MG/ML IV BOLUS
INTRAVENOUS | Status: DC | PRN
  Administered 2022-06-19: 50 mg via INTRAVENOUS
  Administered 2022-06-19: 30 mg via INTRAVENOUS
  Administered 2022-06-19: 60 mg via INTRAVENOUS
  Administered 2022-06-19: 70 mg via INTRAVENOUS
  Administered 2022-06-19: 60 mg via INTRAVENOUS
  Administered 2022-06-19: 100 mg via INTRAVENOUS
  Administered 2022-06-19: 50 mg via INTRAVENOUS
  Administered 2022-06-19: 20 mg via INTRAVENOUS
  Administered 2022-06-19: 50 mg via INTRAVENOUS

## 2022-06-19 NOTE — Anesthesia Postprocedure Evaluation (Signed)
Anesthesia Post Note  Patient: Andrea Baird  Procedure(s) Performed: ESOPHAGOGASTRODUODENOSCOPY (EGD)  Patient location during evaluation: Endoscopy Anesthesia Type: General Level of consciousness: awake and alert Pain management: pain level controlled Vital Signs Assessment: post-procedure vital signs reviewed and stable Respiratory status: spontaneous breathing, nonlabored ventilation, respiratory function stable and patient connected to nasal cannula oxygen Cardiovascular status: blood pressure returned to baseline and stable Postop Assessment: no apparent nausea or vomiting Anesthetic complications: no   No notable events documented.   Last Vitals:  Vitals:   06/19/22 1245 06/19/22 1301  BP: 120/82 122/81  Pulse: 99 81  Resp: 13 11  Temp: (!) 36.1 C   SpO2: 100% 100%    Last Pain:  Vitals:   06/19/22 1245  TempSrc: Temporal  PainSc: 4                  Corinda Gubler

## 2022-06-19 NOTE — Interval H&P Note (Signed)
History and Physical Interval Note: Preprocedure H&P from 06/19/22  was reviewed and there was no interval change after seeing and examining the patient.  Written consent was obtained from the patient after discussion of risks, benefits, and alternatives. Patient has consented to proceed with Esophagogastroduodenoscopy with possible intervention   06/19/2022 12:14 PM  Andrea Baird  has presented today for surgery, with the diagnosis of Esophageal dysphagia (R13.19) Esophageal stenosis (K22.2).  The various methods of treatment have been discussed with the patient and family. After consideration of risks, benefits and other options for treatment, the patient has consented to  Procedure(s): ESOPHAGOGASTRODUODENOSCOPY (EGD) (N/A) as a surgical intervention.  The patient's history has been reviewed, patient examined, no change in status, stable for surgery.  I have reviewed the patient's chart and labs.  Questions were answered to the patient's satisfaction.     Jaynie Collins

## 2022-06-19 NOTE — H&P (Signed)
Pre-Procedure H&P   Patient ID: Andrea Baird is a 52 y.o. female.  Gastroenterology Provider: Jaynie CollinsSteven Michael Seren Chaloux, DO  PCP: Marisue IvanLinthavong, Kanhka, MD  Date: 06/19/2022  HPI [name] is a 52 y.o. female who presents today for Esophagogastroduodenoscopy for Dysphagia .  History of esophageal stenosis requiring dilation.  Last underwent EGD in January 2021 where esophageal stenosis noted at 35 cm from the incisors.  She was dilated with a 36, 42, 48 [place] savary with mucosal disruption.  She has undergone 2 other EGDs as well with dilation.  All 3 EGDs had had biopsies negative for EOE.  Dysphagia to solids with sticking below the sternal notch.  She notes regurgitation.  No liquid dysphagia or odynophagia.  .  It is atypical chest pain.  She was evaluated by cardiology and workup was negative.  She reports episodes last 15 to 120 minutes.  This occurs several times a week and is not associated with her dysphagia.  Does experience reflux and symptoms have increased with trigger foods and stress she does take twice daily PPI, Pepcid and Tums and still has some issues with reflux  Status post hysterectomy and cholecystectomy  Creatinine 0.7 hemoglobin 13.1 MCV 88 platelets 288,000   Past Medical History:  Diagnosis Date   Anemia    Chronic headaches 2018   Complication of anesthesia    sometimes wakes up with a bad headache and Nausa    Elevated cholesterol    GERD (gastroesophageal reflux disease)    Hypertension    Otosclerosis, right 2019   PONV (postoperative nausea and vomiting)     Past Surgical History:  Procedure Laterality Date   ABDOMINAL HYSTERECTOMY     COLONOSCOPY, ESOPHAGOGASTRODUODENOSCOPY (EGD) AND ESOPHAGEAL DILATION     ESOPHAGOGASTRODUODENOSCOPY (EGD) WITH PROPOFOL N/A 05/05/2017   Procedure: ESOPHAGOGASTRODUODENOSCOPY (EGD) WITH PROPOFOL;  Surgeon: Toledo, Boykin Nearingeodoro K, MD;  Location: [facility];  Service: Gastroenterology;  Laterality:  N/A;   HYSTERECTOMY ABDOMINAL WITH SALPINGECTOMY Bilateral 11/2015   LAPAROSCOPIC ABDOMINAL EXPLORATION Bilateral 1990   LAPAROTOMY     laproscopy     LYSIS OF ADHESION N/A 06/12/2017   Procedure: LYSIS OF ADHESION;  Surgeon: Christeen DouglasBeasley, Bethany, MD;  Location: [facility];  Service: Gynecology;  Laterality: N/A;   TUBAL LIGATION      Family History No h/o GI disease or malignancy  Review of Systems  Constitutional:  Negative for activity change, appetite change, chills, diaphoresis, fatigue, fever and unexpected weight change.  HENT:  Positive for trouble swallowing. Negative for voice change.   Respiratory:  Negative for shortness of breath and wheezing.   Cardiovascular:  Positive for chest pain. Negative for palpitations and leg swelling.  Gastrointestinal:  Negative for abdominal distention, abdominal pain, anal bleeding, blood in stool, constipation, diarrhea, nausea, rectal pain and vomiting.  Musculoskeletal:  Negative for arthralgias and myalgias.  Skin:  Negative for color change and pallor.  Neurological:  Negative for dizziness, syncope and weakness.  Psychiatric/Behavioral:  Negative for confusion.   All other systems reviewed and are negative.    Medications No current facility-administered medications on file prior to encounter.   Current Outpatient Medications on File Prior to Encounter  Medication Sig Dispense Refill   amLODipine (NORVASC) 5 MG tablet Take 5 mg by mouth daily.     pantoprazole (PROTONIX) 40 MG tablet Take 40 mg by mouth 2 (two) times daily.     aspirin EC 81 MG tablet Take 81 mg by mouth daily. Swallow whole.  atorvastatin (LIPITOR) 40 MG tablet Take 40 mg by mouth daily.     estradiol (VIVELLE-DOT) 0.1 MG/24HR patch Place 1 patch onto the skin 2 (two) times a week.     meloxicam (MOBIC) 15 MG tablet Take 1 tablet (15 mg total) by mouth daily. 30 tablet 1   methylPREDNISolone (MEDROL DOSEPAK) 4 MG TBPK tablet 6 day dose pack - take as directed 21  tablet 0   metoprolol tartrate (LOPRESSOR) 100 MG tablet Take 1 tablet (100 mg total) by mouth once for 1 dose. Please take one time dose 100mg  metoprolol tartrate 2 hr prior to cardiac CT for HR control IF HR >55bpm. 1 tablet 0   Multiple Vitamin (MULTIVITAMIN) tablet Take 1 tablet by mouth daily.     nortriptyline (PAMELOR) 50 MG capsule Take 25-50 mg by mouth at bedtime. (Patient not taking: Reported on 01/17/2022)      Pertinent medications related to GI and procedure were reviewed by me with the patient prior to the procedure  No current facility-administered medications for this encounter.      No Known Allergies Allergies were reviewed by me prior to the procedure  Objective   Body mass index is 35.58 kg/m. Vitals:   06/19/22 1137  BP: 122/76  Pulse: 79  Resp: 17  Temp: (!) 97.1 F (36.2 C)  TempSrc: Tympanic  SpO2: 100%  Weight: 112.5 kg  Height: 5\' 10"  (1.778 m)     Physical Exam Vitals and nursing note reviewed.  Constitutional:      General: She is not in acute distress.    Appearance: Normal appearance. She is obese. She is not ill-appearing, toxic-appearing or diaphoretic.  HENT:     Head: Normocephalic and atraumatic.     Nose: Nose normal.     Mouth/Throat:     Mouth: Mucous membranes are moist.     Pharynx: Oropharynx is clear.  Eyes:     General: No scleral icterus.    Extraocular Movements: Extraocular movements intact.  Cardiovascular:     Rate and Rhythm: Normal rate and regular rhythm.     Heart sounds: Normal heart sounds. No murmur heard.    No friction rub. No gallop.  Pulmonary:     Effort: Pulmonary effort is normal. No respiratory distress.     Breath sounds: Normal breath sounds. No wheezing, rhonchi or rales.  Abdominal:     General: Bowel sounds are normal. There is no distension.     Palpations: Abdomen is soft.     Tenderness: There is no abdominal tenderness. There is no guarding or rebound.  Musculoskeletal:     Cervical  back: Neck supple.     Right lower leg: No edema.     Left lower leg: No edema.  Skin:    General: Skin is warm and dry.     Coloration: Skin is not jaundiced or pale.  Neurological:     General: No focal deficit present.     Mental Status: She is alert and oriented to person, place, and time. Mental status is at baseline.  Psychiatric:        Mood and Affect: Mood normal.        Behavior: Behavior normal.        Thought Content: Thought content normal.        Judgment: Judgment normal.      Assessment:  Ms. JAICEE WALLMAN is a 52 y.o. female  who presents today for Esophagogastroduodenoscopy for Dysphagia .  Plan:  Esophagogastroduodenoscopy with possible intervention today  Esophagogastroduodenoscopy with possible biopsy, control of bleeding, polypectomy, and interventions as necessary has been discussed with the patient/patient representative. Informed consent was obtained from the patient/patient representative after explaining the indication, nature, and risks of the procedure including but not limited to death, bleeding, perforation, missed neoplasm/lesions, cardiorespiratory compromise, and reaction to medications. Opportunity for questions was given and appropriate answers were provided. Patient/patient representative has verbalized understanding is amenable to undergoing the procedure.   Jaynie Collins, DO  [facility]  Portions of the record may have been created with voice recognition software. Occasional wrong-word or 'sound-a-like' substitutions may have occurred due to the inherent limitations of voice recognition software.  Read the chart carefully and recognize, using context, where substitutions may have occurred.

## 2022-06-19 NOTE — Anesthesia Preprocedure Evaluation (Signed)
Anesthesia Evaluation  Patient identified by MRN, date of birth, ID band Patient awake    Reviewed: Allergy & Precautions, NPO status , Patient's Chart, lab work & pertinent test results  History of Anesthesia Complications (+) PONV and history of anesthetic complications  Airway Mallampati: I  TM Distance: >3 FB Neck ROM: Full    Dental no notable dental hx.    Pulmonary neg pulmonary ROS, neg sleep apnea, neg COPD   breath sounds clear to auscultation- rhonchi (-) wheezing      Cardiovascular Exercise Tolerance: Good hypertension, (-) CAD, (-) Past MI, (-) Cardiac Stents and (-) CABG  Rhythm:Regular Rate:Normal - Systolic murmurs and - Diastolic murmurs    Neuro/Psych  Headaches  negative psych ROS   GI/Hepatic Neg liver ROS,GERD  ,,  Endo/Other  negative endocrine ROSneg diabetes    Renal/GU negative Renal ROS     Musculoskeletal negative musculoskeletal ROS (+)    Abdominal  (+) + obese  Peds  Hematology  (+) Blood dyscrasia, anemia   Anesthesia Other Findings Past Medical History: No date: Anemia No date: Elevated cholesterol No date: GERD (gastroesophageal reflux disease)   Reproductive/Obstetrics                              Anesthesia Physical Anesthesia Plan  ASA: 2  Anesthesia Plan: General   Post-op Pain Management: Minimal or no pain anticipated   Induction: Intravenous  PONV Risk Score and Plan: 4 or greater and Propofol infusion, Ondansetron and TIVA  Airway Management Planned: Natural Airway  Additional Equipment: None  Intra-op Plan:   Post-operative Plan:   Informed Consent: I have reviewed the patients History and Physical, chart, labs and discussed the procedure including the risks, benefits and alternatives for the proposed anesthesia with the patient or authorized representative who has indicated his/her understanding and acceptance.     Dental  advisory given  Plan Discussed with: CRNA and Anesthesiologist  Anesthesia Plan Comments: (Discussed risks of anesthesia with patient, including possibility of difficulty with spontaneous ventilation under anesthesia necessitating airway intervention, PONV, and rare risks such as cardiac or respiratory or neurological events, and allergic reactions. Discussed the role of CRNA in patient's perioperative care. Patient understands.)         Anesthesia Quick Evaluation

## 2022-06-19 NOTE — Transfer of Care (Signed)
Immediate Anesthesia Transfer of Care Note  Patient: Andrea Baird  Procedure(s) Performed: ESOPHAGOGASTRODUODENOSCOPY (EGD)  Patient Location: PACU  Anesthesia Type:General  Level of Consciousness: awake, alert , and oriented  Airway & Oxygen Therapy: Patient Spontanous Breathing  Post-op Assessment: Report given to RN and Post -op Vital signs reviewed and stable  Post vital signs: Reviewed and stable  Last Vitals:  Vitals Value Taken Time  BP 120/82 06/19/22 1245  Temp 97   Pulse 99 06/19/22 1245  Resp 13 06/19/22 1245  SpO2 100 % 06/19/22 1245  Vitals shown include unvalidated device data.  Last Pain:  Vitals:   06/19/22 1137  TempSrc: Tympanic  PainSc: 0-No pain         Complications: No notable events documented.

## 2022-06-19 NOTE — Op Note (Signed)
[facility] Gastroenterology Patient Name: Andrea StanfordSusan Baird Procedure Date: 06/19/2022 12:21 PM MRN: [id number] Account #: [id number] Date of Birth: 12-31-1970 Admit Type: Outpatient Age: [age] Room[facility] END[facility] ROOM 1 Gender: Female Note Status: Finalized Instrument Name: Upper Endoscope [id number] Procedure:             Upper GI endoscopy Indications:           Dysphagia Providers:             Jaynie CollinsSteven Michael Hisayo Delossantos DO, DO Referring MD:          Marisue IvanKanhka Linthavong (Referring MD) Medicines:             Monitored Anesthesia Care Complications:         No immediate complications. Estimated blood loss:                         Minimal. Procedure:             Pre-Anesthesia Assessment:                        - Prior to the procedure, a History and Physical was                         performed, and patient medications and allergies were                         reviewed. The patient is competent. The risks and                         benefits of the procedure and the sedation options and                         risks were discussed with the patient. All questions                         were answered and informed consent was obtained.                         Patient identification and proposed procedure were                         verified by the physician, the nurse, the anesthetist                         and the technician in the endoscopy suite. Mental                         Status Examination: alert and oriented. Airway                         Examination: normal oropharyngeal airway and neck                         mobility. Respiratory Examination: clear to                         auscultation. CV Examination: RRR, no murmurs, no S3  or S4. Prophylactic Antibiotics: The patient does not                         require prophylactic antibiotics. Prior                         Anticoagulants: The patient has taken no anticoagulant                          or antiplatelet agents. ASA Grade Assessment: II - A                         patient with mild systemic disease. After reviewing                         the risks and benefits, the patient was deemed in                         satisfactory condition to undergo the procedure. The                         anesthesia plan was to use monitored anesthesia care                         (MAC). Immediately prior to administration of                         medications, the patient was re-assessed for adequacy                         to receive sedatives. The heart rate, respiratory                         rate, oxygen saturations, blood pressure, adequacy of                         pulmonary ventilation, and response to care were                         monitored throughout the procedure. The physical                         status of the patient was re-assessed after the                         procedure.                        After obtaining informed consent, the endoscope was                         passed under direct vision. Throughout the procedure,                         the patient's blood pressure, pulse, and oxygen                         saturations were monitored continuously. The Endoscope  was introduced through the mouth, and advanced to the                         second part of duodenum. The upper GI endoscopy was                         accomplished without difficulty. The patient tolerated                         the procedure well. Findings:      The duodenal bulb, first portion of the duodenum and second portion of       the duodenum were normal. Estimated blood loss: none.      The entire examined stomach was normal. Estimated blood loss: none.      Abnormal motility was noted in the esophagus. The cricopharyngeus was       normal. There is spasticity of the esophageal body. The distal       esophagus/lower esophageal sphincter is open. Estimated blood  loss: none.      One benign-appearing, intrinsic mild (non-circumferential scarring)       stenosis was found 35 cm from the incisors. This stenosis measured 1.2       cm (inner diameter) x less than one cm (in length). The stenosis was       traversed. A guidewire was placed and the scope was withdrawn. Dilation       was performed with a Savary dilator with no resistance at 42 Fr, 45 Fr       and 48 Fr. The dilation site was examined following endoscope       reinsertion and showed mild mucosal disruption. Disruption only occurred       after 48Fr. No disruption upon relook after 42 and 45 Fr savary were       passed Estimated blood loss was minimal.      The Z-line was regular. Estimated blood loss: none.      Esophagogastric landmarks were identified: the gastroesophageal junction       was found at 35 cm from the incisors. Impression:            - Normal duodenal bulb, first portion of the duodenum                         and second portion of the duodenum.                        - Normal stomach.                        - Abnormal esophageal motility, suspicious for                         esophageal spasm.                        - Benign-appearing esophageal stenosis. Dilated.                        - Z-line regular.                        - Esophagogastric landmarks identified.                        -  No specimens collected. Recommendation:        - Patient has a contact number available for                         emergencies. The signs and symptoms of potential                         delayed complications were discussed with the patient.                         Return to normal activities tomorrow. Written                         discharge instructions were provided to the patient.                        - Advance diet as tolerated and soft diet today.                        - Continue present medications.                        - No ibuprofen, naproxen, or other non-steroidal                          anti-inflammatory drugs.                        - Repeat upper endoscopy PRN for retreatment.                        - Return to GI office as previously scheduled.                        - The findings and recommendations were discussed with                         the patient. Procedure Code(s):     --- Professional ---                        [phone], Esophagogastroduodenoscopy, flexible,                         transoral; with insertion of guide wire followed by                         passage of dilator(s) through esophagus over guide wire Diagnosis Code(s):     --- Professional ---                        K22.4, Dyskinesia of esophagus                        K22.2, Esophageal obstruction                        R13.10, Dysphagia, unspecified CPT copyright 2022 American Medical Association. All rights reserved. The codes documented in this report are preliminary and upon coder review may  be revised to meet current compliance requirements. Attending Participation:  I personally performed the entire procedure. Elfredia Nevins, DO Jaynie Collins DO, DO 06/19/2022 12:46:05 PM This report has been signed electronically. Number of Addenda: 0 Note Initiated On: 06/19/2022 12:21 PM Estimated Blood Loss:  Estimated blood loss was minimal.      [facility]

## 2022-06-20 ENCOUNTER — Encounter: Payer: Self-pay | Admitting: Gastroenterology

## 2022-07-01 ENCOUNTER — Ambulatory Visit: Payer: 59 | Admitting: Podiatry

## 2022-07-01 DIAGNOSIS — M7662 Achilles tendinitis, left leg: Secondary | ICD-10-CM

## 2022-07-01 DIAGNOSIS — M7661 Achilles tendinitis, right leg: Secondary | ICD-10-CM | POA: Diagnosis not present

## 2022-07-01 MED ORDER — BETAMETHASONE SOD PHOS & ACET 6 (3-3) MG/ML IJ SUSP
3.0000 mg | Freq: Once | INTRAMUSCULAR | Status: AC
Start: 2022-07-01 — End: 2022-07-01
  Administered 2022-07-01: 3 mg via INTRA_ARTICULAR

## 2022-07-01 NOTE — Progress Notes (Signed)
Chief Complaint  Patient presents with   Foot Pain    Patient came in today for bilateral tendinitis, back of the heel pain, rate of pain 7 out of 10, patient states she would ike injections today, and also to talk about surgery,     HPI: 52 y.o. female presenting today for follow-up evaluation of symptomatic Achilles tendinitis of bilateral lower extremities.  Patient was last seen in the office 01/17/2022.  At that time cortisone injection was administered and she did feel relief for a few months but the pain has slowly returned back to her original pain and symptoms.  She has now been dealing with this pain for over 1 year now.  She is very frustrated.  Presents for further treatment evaluation.  Past Medical History:  Diagnosis Date   Anemia    Chronic headaches 2018   Complication of anesthesia    sometimes wakes up with a bad headache and Nausa    Elevated cholesterol    GERD (gastroesophageal reflux disease)    Hypertension    Otosclerosis, right 2019   PONV (postoperative nausea and vomiting)     Past Surgical History:  Procedure Laterality Date   ABDOMINAL HYSTERECTOMY     COLONOSCOPY, ESOPHAGOGASTRODUODENOSCOPY (EGD) AND ESOPHAGEAL DILATION     ESOPHAGOGASTRODUODENOSCOPY N/A 06/19/2022   Procedure: ESOPHAGOGASTRODUODENOSCOPY (EGD);  Surgeon: Jaynie Collins, DO;  Location: [facility] ENDOSCOPY;  Service: Gastroenterology;  Laterality: N/A;   ESOPHAGOGASTRODUODENOSCOPY (EGD) WITH PROPOFOL N/A 05/05/2017   Procedure: ESOPHAGOGASTRODUODENOSCOPY (EGD) WITH PROPOFOL;  Surgeon: Toledo, Boykin Nearing, MD;  Location: [facility] ENDOSCOPY;  Service: Gastroenterology;  Laterality: N/A;   HYSTERECTOMY ABDOMINAL WITH SALPINGECTOMY Bilateral 11/2015   LAPAROSCOPIC ABDOMINAL EXPLORATION Bilateral 1990   LAPAROTOMY     laproscopy     LYSIS OF ADHESION N/A 06/12/2017   Procedure: LYSIS OF ADHESION;  Surgeon: Christeen Douglas, MD;  Location: [facility] ORS;  Service: Gynecology;  Laterality: N/A;    TUBAL LIGATION      No Known Allergies   Physical Exam: General: The patient is alert and oriented x3 in no acute distress.  Dermatology: Skin is warm, dry and supple bilateral lower extremities. Negative for open lesions or macerations.  Vascular: Palpable pedal pulses bilaterally. No edema or erythema noted. Capillary refill within normal limits.  Neurological: Epicritic and protective threshold grossly intact bilaterally.   Musculoskeletal Exam: Pain on palpation noted to the posterior tubercle of the right calcaneus at the insertion of the Achilles tendon consistent with retrocalcaneal bursitis as well as the left lower extremity as well today. Range of motion within normal limits. Muscle strength 5/5 in all muscle groups bilateral lower extremities.  Radiographic Exam B/L feet:  Posterior and plantar calcaneal spur noted to the respective calcaneus on lateral view. No fracture or dislocation noted. Normal osseous mineralization noted.     Assessment: 1. Insertional Achilles tendinitis bilateral 2.  Posterior heel spur bilateral 3.  Plantar fibroma left; asymptomatic  -Patient evaluated. -Injection of 0.5 cc Celestone Soluspan injected along the posterior aspect of the ankle -Order placed for physical therapy at [facility] PT -The patient has now tried immobilization cam boot, steroid and nonsteroidal Inflammatories both oral and injection.  She continues to take meloxicam 15 mg daily.  All these conservative modalities have not provided any lasting alleviation of symptoms with the patient  -If the patient does not improve with physical therapy we may need to consider surgical intervention at that time. -Continue topical diclofenac  -Return to clinic 1  month for follow-up and possible surgical consultation   Felecia Shelling, DPM [facility]  Dr. Felecia Shelling, DPM    [address] [place], [place] [address]                 Office [phone]  Fax [phone]

## 2022-07-15 ENCOUNTER — Ambulatory Visit: Payer: 59 | Admitting: Podiatry

## 2022-07-16 ENCOUNTER — Encounter: Payer: Self-pay | Admitting: Gastroenterology

## 2022-07-30 NOTE — H&P (Signed)
Pre-Procedure H&P   Patient ID: Andrea Baird is a 52 y.o. female.  Gastroenterology Provider: Jaynie Collins, DO  PCP: Marisue Ivan, MD  Date: 07/31/2022  HPI [name] is a 52 y.o. female who presents today for Esophagogastroduodenoscopy and Colonoscopy for Chronic diarrhea, dysphagia .  Patient has a history of esophageal stenosis requiring dilation for dysphagia symptoms.  She last underwent EGD with dilation in April of this year.  Able to undergo 42, 45, 48 French savory dilation.  Mild mucosal disruption noted at 48 [place].  Abnormal motility was also appreciated during exam.  Stenosis is noted at 35 cm from the incisors.  Other EGDs in 2021, 2019, 2017.  Appetite and weight remain stable.  Still issues with bread rice meats but no issues with pills liquids or odynophagia.  Experiences atypical chest pain that has been worked up by cardiology.  Twice daily PPI has helped.  She notes increasing dysphagia with trigger foods and stress.  Last underwent colonoscopy in 2021 demonstrating 2 tubular adenomatous polyps, 1 sessile serrated polyp, internal hemorrhoids and normal TI.  Her mother and father have a history of colorectal cancer.  She reports chronic diarrhea.  Was previously having 1-2 formed bowel movements daily and now has 3-4 loose bowel movements daily.  No melena hematochezia but she does note nocturnal awakenings and urgency.  No tenesmus.  Status post cholecystectomy in January 2022.  Hemoglobin 13.5 MCV 87.7 platelets 298,000 creatinine 0.7.  Celiac studies negative.  Fecal calprotectin ESR and CRP negative   Past Medical History:  Diagnosis Date   Anemia    Chronic headaches 2018   Complication of anesthesia    sometimes wakes up with a bad headache and Nausa    Elevated cholesterol    GERD (gastroesophageal reflux disease)    Hypertension    Otosclerosis, right 2019   PONV (postoperative nausea and vomiting)     Past Surgical  History:  Procedure Laterality Date   ABDOMINAL HYSTERECTOMY     COLONOSCOPY, ESOPHAGOGASTRODUODENOSCOPY (EGD) AND ESOPHAGEAL DILATION     ESOPHAGOGASTRODUODENOSCOPY N/A 06/19/2022   Procedure: ESOPHAGOGASTRODUODENOSCOPY (EGD);  Surgeon: Jaynie Collins, DO;  Location: [facility] ENDOSCOPY;  Service: Gastroenterology;  Laterality: N/A;   ESOPHAGOGASTRODUODENOSCOPY (EGD) WITH PROPOFOL N/A 05/05/2017   Procedure: ESOPHAGOGASTRODUODENOSCOPY (EGD) WITH PROPOFOL;  Surgeon: Toledo, Boykin Nearing, MD;  Location: [facility] ENDOSCOPY;  Service: Gastroenterology;  Laterality: N/A;   HYSTERECTOMY ABDOMINAL WITH SALPINGECTOMY Bilateral 11/2015   LAPAROSCOPIC ABDOMINAL EXPLORATION Bilateral 1990   LAPAROTOMY     laproscopy     LYSIS OF ADHESION N/A 06/12/2017   Procedure: LYSIS OF ADHESION;  Surgeon: Christeen Douglas, MD;  Location: [facility] ORS;  Service: Gynecology;  Laterality: N/A;   TUBAL LIGATION      Family History Mother and father with CRC No h/o GI disease or malignancy  Review of Systems  Constitutional:  Negative for activity change, appetite change, chills, diaphoresis, fatigue, fever and unexpected weight change.  HENT:  Positive for trouble swallowing. Negative for voice change.   Respiratory:  Negative for shortness of breath and wheezing.   Cardiovascular:  Positive for chest pain. Negative for palpitations and leg swelling.  Gastrointestinal:  Positive for diarrhea. Negative for abdominal distention, abdominal pain, anal bleeding, blood in stool, constipation, nausea, rectal pain and vomiting.  Musculoskeletal:  Negative for arthralgias and myalgias.  Skin:  Negative for color change and pallor.  Neurological:  Negative for dizziness, syncope and weakness.  Psychiatric/Behavioral:  Negative for confusion.  All other systems reviewed and are negative.    Medications No current facility-administered medications on file prior to encounter.   Current Outpatient Medications on File Prior to  Encounter  Medication Sig Dispense Refill   amLODipine (NORVASC) 5 MG tablet Take 5 mg by mouth daily.     aspirin EC 81 MG tablet Take 81 mg by mouth daily. Swallow whole.     meloxicam (MOBIC) 15 MG tablet Take 1 tablet (15 mg total) by mouth daily. 30 tablet 1   Multiple Vitamin (MULTIVITAMIN) tablet Take 1 tablet by mouth daily.     pantoprazole (PROTONIX) 40 MG tablet Take 40 mg by mouth 2 (two) times daily.     rosuvastatin (CRESTOR) 5 MG tablet Take 5 mg by mouth daily.     atorvastatin (LIPITOR) 40 MG tablet Take 40 mg by mouth daily. (Patient not taking: Reported on 07/31/2022)     estradiol (VIVELLE-DOT) 0.1 MG/24HR patch Place 1 patch onto the skin 2 (two) times a week.     methylPREDNISolone (MEDROL DOSEPAK) 4 MG TBPK tablet 6 day dose pack - take as directed (Patient not taking: Reported on 07/31/2022) 21 tablet 0   metoprolol tartrate (LOPRESSOR) 100 MG tablet Take 1 tablet (100 mg total) by mouth once for 1 dose. Please take one time dose 100mg  metoprolol tartrate 2 hr prior to cardiac CT for HR control IF HR >55bpm. 1 tablet 0   nortriptyline (PAMELOR) 50 MG capsule Take 25-50 mg by mouth at bedtime. (Patient not taking: Reported on 01/17/2022)      Pertinent medications related to GI and procedure were reviewed by me with the patient prior to the procedure   Current Facility-Administered Medications:    0.9 %  sodium chloride infusion, , Intravenous, Continuous, Jaynie Collins, DO, Last Rate: 20 mL/hr at 07/31/22 1015, New Bag at 07/31/22 1015  sodium chloride 20 mL/hr at 07/31/22 1015       No Known Allergies Allergies were reviewed by me prior to the procedure  Objective   Body mass index is 36.65 kg/m. Vitals:   07/31/22 0959  BP: 122/82  Pulse: 83  Resp: 17  Temp: 97.7 F (36.5 C)  TempSrc: Temporal  SpO2: 98%  Weight: 115.8 kg  Height: 5\' 10"  (1.778 m)     Physical Exam Vitals and nursing note reviewed.  Constitutional:      General: She is  not in acute distress.    Appearance: Normal appearance. She is obese. She is not ill-appearing, toxic-appearing or diaphoretic.  HENT:     Head: Normocephalic and atraumatic.     Nose: Nose normal.     Mouth/Throat:     Mouth: Mucous membranes are moist.     Pharynx: Oropharynx is clear.  Eyes:     General: No scleral icterus.    Extraocular Movements: Extraocular movements intact.  Cardiovascular:     Rate and Rhythm: Normal rate and regular rhythm.     Heart sounds: Normal heart sounds. No murmur heard.    No friction rub. No gallop.  Pulmonary:     Effort: Pulmonary effort is normal. No respiratory distress.     Breath sounds: Normal breath sounds. No wheezing, rhonchi or rales.  Abdominal:     General: Bowel sounds are normal. There is no distension.     Palpations: Abdomen is soft.     Tenderness: There is no abdominal tenderness. There is no guarding or rebound.  Musculoskeletal:     Cervical back:  Neck supple.     Right lower leg: No edema.     Left lower leg: No edema.  Skin:    General: Skin is warm and dry.     Coloration: Skin is not jaundiced or pale.  Neurological:     General: No focal deficit present.     Mental Status: She is alert and oriented to person, place, and time. Mental status is at baseline.  Psychiatric:        Mood and Affect: Mood normal.        Behavior: Behavior normal.        Thought Content: Thought content normal.        Judgment: Judgment normal.      Assessment:  [name] is a 52 y.o. female  who presents today for Esophagogastroduodenoscopy and Colonoscopy for Chronic diarrhea, dysphagia .  Plan:  Esophagogastroduodenoscopy and Colonoscopy with possible intervention today  Esophagogastroduodenoscopy and Colonoscopy with possible biopsy, control of bleeding, polypectomy, and interventions as necessary has been discussed with the patient/patient representative. Informed consent was obtained from the patient/patient  representative after explaining the indication, nature, and risks of the procedure including but not limited to death, bleeding, perforation, missed neoplasm/lesions, cardiorespiratory compromise, and reaction to medications. Opportunity for questions was given and appropriate answers were provided. Patient/patient representative has verbalized understanding is amenable to undergoing the procedure.   Jaynie Collins, DO  [facility]  Portions of the record may have been created with voice recognition software. Occasional wrong-word or 'sound-a-like' substitutions may have occurred due to the inherent limitations of voice recognition software.  Read the chart carefully and recognize, using context, where substitutions may have occurred.

## 2022-07-31 ENCOUNTER — Encounter: Admission: RE | Payer: Self-pay | Source: Home / Self Care

## 2022-07-31 ENCOUNTER — Ambulatory Visit
Admission: RE | Admit: 2022-07-31 | Discharge: 2022-07-31 | Disposition: A | Discharge status: Home / Self Care | Payer: 59 | Attending: Gastroenterology | Admitting: Gastroenterology

## 2022-07-31 ENCOUNTER — Ambulatory Visit: Admission: RE | Admit: 2022-07-31 | Payer: 59 | Source: Home / Self Care | Admitting: Gastroenterology

## 2022-07-31 ENCOUNTER — Ambulatory Visit: Payer: 59 | Admitting: Certified Registered"

## 2022-07-31 ENCOUNTER — Encounter: Payer: Self-pay | Admitting: Gastroenterology

## 2022-07-31 ENCOUNTER — Encounter
Admission: RE | Disposition: A | Discharge status: Home / Self Care | Payer: Self-pay | Source: Home / Self Care | Attending: Gastroenterology

## 2022-07-31 DIAGNOSIS — K222 Esophageal obstruction: Secondary | ICD-10-CM | POA: Diagnosis not present

## 2022-07-31 DIAGNOSIS — I10 Essential (primary) hypertension: Secondary | ICD-10-CM | POA: Insufficient documentation

## 2022-07-31 DIAGNOSIS — K529 Noninfective gastroenteritis and colitis, unspecified: Secondary | ICD-10-CM | POA: Diagnosis not present

## 2022-07-31 DIAGNOSIS — R0789 Other chest pain: Secondary | ICD-10-CM | POA: Diagnosis not present

## 2022-07-31 DIAGNOSIS — K219 Gastro-esophageal reflux disease without esophagitis: Secondary | ICD-10-CM | POA: Diagnosis not present

## 2022-07-31 DIAGNOSIS — R1310 Dysphagia, unspecified: Secondary | ICD-10-CM | POA: Insufficient documentation

## 2022-07-31 DIAGNOSIS — Z79899 Other long term (current) drug therapy: Secondary | ICD-10-CM | POA: Diagnosis not present

## 2022-07-31 DIAGNOSIS — Z9049 Acquired absence of other specified parts of digestive tract: Secondary | ICD-10-CM | POA: Diagnosis not present

## 2022-07-31 DIAGNOSIS — K640 First degree hemorrhoids: Secondary | ICD-10-CM | POA: Insufficient documentation

## 2022-07-31 DIAGNOSIS — Z800 Family history of malignant neoplasm of digestive organs: Secondary | ICD-10-CM | POA: Insufficient documentation

## 2022-07-31 HISTORY — PX: ESOPHAGOGASTRODUODENOSCOPY: SHX5428

## 2022-07-31 HISTORY — PX: COLONOSCOPY WITH PROPOFOL: SHX5780

## 2022-07-31 SURGERY — EGD (ESOPHAGOGASTRODUODENOSCOPY)
Anesthesia: General

## 2022-07-31 SURGERY — COLONOSCOPY WITH PROPOFOL
Anesthesia: General

## 2022-07-31 MED ORDER — PROPOFOL 500 MG/50ML IV EMUL
INTRAVENOUS | Status: DC | PRN
  Administered 2022-07-31: 150 ug/kg/min via INTRAVENOUS

## 2022-07-31 MED ORDER — SODIUM CHLORIDE 0.9 % IV SOLN: INTRAVENOUS | Status: DC

## 2022-07-31 MED ORDER — PROPOFOL 10 MG/ML IV BOLUS
INTRAVENOUS | Status: DC | PRN
  Administered 2022-07-31 (×2): 20 mg via INTRAVENOUS
  Administered 2022-07-31: 30 mg via INTRAVENOUS
  Administered 2022-07-31 (×2): 20 mg via INTRAVENOUS
  Administered 2022-07-31: 150 mg via INTRAVENOUS
  Administered 2022-07-31: 30 mg via INTRAVENOUS
  Administered 2022-07-31: 20 mg via INTRAVENOUS
  Administered 2022-07-31: 50 mg via INTRAVENOUS
  Administered 2022-07-31 (×2): 30 mg via INTRAVENOUS
  Administered 2022-07-31: 50 mg via INTRAVENOUS
  Administered 2022-07-31: 30 mg via INTRAVENOUS

## 2022-07-31 MED ORDER — DEXMEDETOMIDINE HCL IN NACL 80 MCG/20ML IV SOLN
INTRAVENOUS | Status: DC | PRN
  Administered 2022-07-31: 12 ug via INTRAVENOUS

## 2022-07-31 MED ORDER — DIPHENHYDRAMINE HCL 50 MG/ML IJ SOLN
INTRAMUSCULAR | Status: DC | PRN
  Administered 2022-07-31: 6.25 mg via INTRAVENOUS

## 2022-07-31 MED ORDER — FENTANYL CITRATE (PF) 100 MCG/2ML IJ SOLN
INTRAMUSCULAR | Status: DC | PRN
  Administered 2022-07-31 (×2): 50 ug via INTRAVENOUS

## 2022-07-31 MED ORDER — LIDOCAINE HCL (CARDIAC) PF 100 MG/5ML IV SOSY
PREFILLED_SYRINGE | INTRAVENOUS | Status: DC | PRN
  Administered 2022-07-31: 100 mg via INTRAVENOUS

## 2022-07-31 MED ORDER — ONDANSETRON HCL 4 MG/2ML IJ SOLN
INTRAMUSCULAR | Status: DC | PRN
  Administered 2022-07-31: 4 mg via INTRAVENOUS

## 2022-07-31 MED ORDER — GLYCOPYRROLATE 0.2 MG/ML IJ SOLN
INTRAMUSCULAR | Status: DC | PRN
  Administered 2022-07-31: .2 mg via INTRAVENOUS

## 2022-07-31 MED ORDER — ONDANSETRON HCL 4 MG/2ML IJ SOLN: INTRAMUSCULAR | Status: DC | PRN

## 2022-07-31 MED ORDER — FENTANYL CITRATE (PF) 100 MCG/2ML IJ SOLN
INTRAMUSCULAR | Status: AC
  Filled 2022-07-31: qty 2

## 2022-07-31 NOTE — Anesthesia Procedure Notes (Signed)
Procedure Name: MAC Date/Time: 07/31/2022 10:37 AM  Performed by: Cheral Bay, CRNAPre-anesthesia Checklist: Patient identified, Emergency Drugs available, Suction available, Patient being monitored and Timeout performed Patient Re-evaluated:Patient Re-evaluated prior to induction Oxygen Delivery Method: Simple face mask Induction Type: IV induction Placement Confirmation: positive ETCO2 and CO2 detector

## 2022-07-31 NOTE — Anesthesia Postprocedure Evaluation (Signed)
Anesthesia Post Note  Patient: Andrea Baird  Procedure(s) Performed: COLONOSCOPY WITH PROPOFOL ESOPHAGOGASTRODUODENOSCOPY (EGD)  Patient location during evaluation: PACU Anesthesia Type: General Level of consciousness: awake and awake and alert Pain management: satisfactory to patient Vital Signs Assessment: post-procedure vital signs reviewed and stable Respiratory status: spontaneous breathing and nonlabored ventilation Cardiovascular status: blood pressure returned to baseline Anesthetic complications: no   No notable events documented.   Last Vitals:  Vitals:   07/31/22 1129 07/31/22 1139  BP:  104/69  Pulse: 75 73  Resp:    Temp:    SpO2: 100% 100%    Last Pain:  Vitals:   07/31/22 1139  TempSrc:   PainSc: 0-No pain                 VAN STAVEREN,Justun Anaya

## 2022-07-31 NOTE — Interval H&P Note (Signed)
History and Physical Interval Note: Preprocedure H&P from 07/31/22  was reviewed and there was no interval change after seeing and examining the patient.  Written consent was obtained from the patient after discussion of risks, benefits, and alternatives. Patient has consented to proceed with Esophagogastroduodenoscopy and Colonoscopy with possible intervention   07/31/2022 10:28 AM  Andrea Baird  has presented today for surgery, with the diagnosis of CHRONIC DIARRHEA.  The various methods of treatment have been discussed with the patient and family. After consideration of risks, benefits and other options for treatment, the patient has consented to  Procedure(s): COLONOSCOPY WITH PROPOFOL (N/A) ESOPHAGOGASTRODUODENOSCOPY (EGD) (N/A) as a surgical intervention.  The patient's history has been reviewed, patient examined, no change in status, stable for surgery.  I have reviewed the patient's chart and labs.  Questions were answered to the patient's satisfaction.     Jaynie Collins

## 2022-07-31 NOTE — Anesthesia Preprocedure Evaluation (Signed)
Anesthesia Evaluation  Patient identified by MRN, date of birth, ID band Patient awake    Reviewed: Allergy & Precautions, NPO status , Patient's Chart, lab work & pertinent test results  Airway Mallampati: III  TM Distance: >3 FB Neck ROM: Full    Dental  (+) Teeth Intact   Pulmonary neg pulmonary ROS   Pulmonary exam normal breath sounds clear to auscultation       Cardiovascular Exercise Tolerance: Good hypertension, Pt. on medications negative cardio ROS Normal cardiovascular exam Rhythm:Regular Rate:Normal     Neuro/Psych  Headaches negative neurological ROS  negative psych ROS   GI/Hepatic negative GI ROS, Neg liver ROS,GERD  Medicated,,  Endo/Other  negative endocrine ROS  Morbid obesity  Renal/GU negative Renal ROS  negative genitourinary   Musculoskeletal   Abdominal  (+) + obese  Peds negative pediatric ROS (+)  Hematology negative hematology ROS (+)   Anesthesia Other Findings Past Medical History: No date: Anemia 2018: Chronic headaches No date: Complication of anesthesia     Comment:  sometimes wakes up with a bad headache and Nausa  No date: Elevated cholesterol No date: GERD (gastroesophageal reflux disease) No date: Hypertension 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, right No date: PONV (postoperative nausea and vomiting)  Past Surgical History: No date: ABDOMINAL HYSTERECTOMY No date: COLONOSCOPY, ESOPHAGOGASTRODUODENOSCOPY (EGD) AND ESOPHAGEAL  DILATION 06/19/2022: ESOPHAGOGASTRODUODENOSCOPY; N/A     Comment:  Procedure: ESOPHAGOGASTRODUODENOSCOPY (EGD);  Surgeon:               Jaynie Collins, DO;  Location: [facility] ENDOSCOPY;                Service: Gastroenterology;  Laterality: N/A; 05/05/2017: ESOPHAGOGASTRODUODENOSCOPY (EGD) WITH PROPOFOL; N/A     Comment:  Procedure: ESOPHAGOGASTRODUODENOSCOPY (EGD) WITH               PROPOFOL;  Surgeon: Toledo, Boykin Nearing, MD;  Location:               [facility]  ENDOSCOPY;  Service: Gastroenterology;  Laterality:               N/A; 11/2015: HYSTERECTOMY ABDOMINAL WITH SALPINGECTOMY; Bilateral 1990: LAPAROSCOPIC ABDOMINAL EXPLORATION; Bilateral No date: LAPAROTOMY No date: laproscopy 06/12/2017: LYSIS OF ADHESION; N/A     Comment:  Procedure: LYSIS OF ADHESION;  Surgeon: Christeen Douglas, MD;  Location: [facility] ORS;  Service: Gynecology;                Laterality: N/A; No date: TUBAL LIGATION  BMI    Body Mass Index: 36.65 kg/m      Reproductive/Obstetrics negative OB ROS                             Anesthesia Physical Anesthesia Plan  ASA: 3  Anesthesia Plan: General   Post-op Pain Management:    Induction: Intravenous  PONV Risk Score and Plan: Propofol infusion and TIVA  Airway Management Planned: Natural Airway  Additional Equipment:   Intra-op Plan:   Post-operative Plan:   Informed Consent: I have reviewed the patients History and Physical, chart, labs and discussed the procedure including the risks, benefits and alternatives for the proposed anesthesia with the patient or authorized representative who has indicated his/her understanding and acceptance.     Dental Advisory Given  Plan Discussed with: CRNA and Surgeon  Anesthesia Plan Comments:  Anesthesia Quick Evaluation

## 2022-07-31 NOTE — Transfer of Care (Signed)
Immediate Anesthesia Transfer of Care Note  Patient: Andrea Baird  Procedure(s) Performed: COLONOSCOPY WITH PROPOFOL ESOPHAGOGASTRODUODENOSCOPY (EGD)  Patient Location: PACU and Endoscopy Unit  Anesthesia Type:General  Level of Consciousness: awake  Airway & Oxygen Therapy: Patient Spontanous Breathing  Post-op Assessment: Report given to RN and Post -op Vital signs reviewed and stable  Post vital signs: Reviewed and stable  Last Vitals:  Vitals Value Taken Time  BP 118/72 07/31/22 1122  Temp 36.3 C 07/31/22 1119  Pulse 93 07/31/22 1123  Resp 13 07/31/22 1123  SpO2 100 % 07/31/22 1123  Vitals shown include unvalidated device data.  Last Pain:  Vitals:   07/31/22 1119  TempSrc: Temporal  PainSc: Asleep         Complications: No notable events documented.

## 2022-07-31 NOTE — Op Note (Signed)
[facility] Gastroenterology Patient Name: Andrea Baird Procedure Date: 07/31/2022 10:29 AM MRN: [id number] Account #: [id number] Date of Birth: 03-20-1970 Admit Type: Outpatient Age: 52 Room: [facility] ENDO ROOM 1 Gender: Female Note Status: Finalized Instrument Name: Colonoscope [id number] Procedure:             Colonoscopy Indications:           Chronic diarrhea Providers:             Jaynie Collins DO, DO Referring MD:          Marisue Ivan (Referring MD) Medicines:             Monitored Anesthesia Care Complications:         No immediate complications. Estimated blood loss:                         Minimal. Procedure:             Pre-Anesthesia Assessment:                        - Prior to the procedure, a History and Physical was                         performed, and patient medications and allergies were                         reviewed. The patient is competent. The risks and                         benefits of the procedure and the sedation options and                         risks were discussed with the patient. All questions                         were answered and informed consent was obtained.                         Patient identification and proposed procedure were                         verified by the physician, the nurse, the anesthetist                         and the technician in the endoscopy suite. Mental                         Status Examination: alert and oriented. Airway                         Examination: normal oropharyngeal airway and neck                         mobility. Respiratory Examination: clear to                         auscultation. CV Examination: RRR, no murmurs, no S3  or S4. Prophylactic Antibiotics: The patient does not                         require prophylactic antibiotics. Prior                         Anticoagulants: The patient has taken no anticoagulant                         or  antiplatelet agents. ASA Grade Assessment: II - A                         patient with mild systemic disease. After reviewing                         the risks and benefits, the patient was deemed in                         satisfactory condition to undergo the procedure. The                         anesthesia plan was to use monitored anesthesia care                         (MAC). Immediately prior to administration of                         medications, the patient was re-assessed for adequacy                         to receive sedatives. The heart rate, respiratory                         rate, oxygen saturations, blood pressure, adequacy of                         pulmonary ventilation, and response to care were                         monitored throughout the procedure. The physical                         status of the patient was re-assessed after the                         procedure.                        After obtaining informed consent, the colonoscope was                         passed under direct vision. Throughout the procedure,                         the patient's blood pressure, pulse, and oxygen                         saturations were monitored continuously. The  Colonoscope was introduced through the anus and                         advanced to the the terminal ileum, with                         identification of the appendiceal orifice and IC                         valve. The colonoscopy was performed without                         difficulty. The patient tolerated the procedure well.                         The quality of the bowel preparation was evaluated                         using the BBPS [facility] Bowel Preparation Scale) with                         scores of: Right Colon = 2 (minor amount of residual                         staining, small fragments of stool and/or opaque                         liquid, but mucosa seen well),  Transverse Colon = 3                         (entire mucosa seen well with no residual staining,                         small fragments of stool or opaque liquid) and Left                         Colon = 3 (entire mucosa seen well with no residual                         staining, small fragments of stool or opaque liquid).                         The total BBPS score equals 8. The quality of the                         bowel preparation was excellent. The terminal ileum,                         ileocecal valve, appendiceal orifice, and rectum were                         photographed. Findings:      The perianal and digital rectal examinations were normal. Pertinent       negatives include normal sphincter tone.      The terminal ileum appeared normal. Estimated blood loss: none.      Normal mucosa was found in the entire colon. Biopsies for  histology were       taken with a cold forceps from the right colon and left colon for       evaluation of microscopic colitis. Estimated blood loss was minimal.      The exam was otherwise without abnormality on direct and retroflexion       views.      Non-bleeding internal hemorrhoids were found during retroflexion. The       hemorrhoids were Grade I (internal hemorrhoids that do not prolapse).       Estimated blood loss: none. Impression:            - The examined portion of the ileum was normal.                        - Normal mucosa in the entire examined colon. Biopsied.                        - The examination was otherwise normal on direct and                         retroflexion views.                        - Non-bleeding internal hemorrhoids. Recommendation:        - Patient has a contact number available for                         emergencies. The signs and symptoms of potential                         delayed complications were discussed with the patient.                         Return to normal activities tomorrow. Written                          discharge instructions were provided to the patient.                        - Discharge patient to home.                        - Soft diet today.                        - Continue present medications.                        - Await pathology results.                        - Repeat colonoscopy for surveillance based on                         pathology results.                        - Return to GI office as previously scheduled.                        - The findings and recommendations were  discussed with                         the patient. Procedure Code(s):     --- Professional ---                        [phone], Colonoscopy, flexible; with biopsy, single or                         multiple Diagnosis Code(s):     --- Professional ---                        K64.0, First degree hemorrhoids                        K52.9, Noninfective gastroenteritis and colitis,                         unspecified CPT copyright 2022 American Medical Association. All rights reserved. The codes documented in this report are preliminary and upon coder review may  be revised to meet current compliance requirements. Attending Participation:      I personally performed the entire procedure. Elfredia Nevins, DO Jaynie Collins DO, DO 07/31/2022 11:22:02 AM This report has been signed electronically. Number of Addenda: 0 Note Initiated On: 07/31/2022 10:29 AM Scope Withdrawal Time: 0 hours 7 minutes 35 seconds  Total Procedure Duration: 0 hours 11 minutes 47 seconds  Estimated Blood Loss:  Estimated blood loss was minimal.      [facility]

## 2022-07-31 NOTE — Op Note (Signed)
[facility] Gastroenterology Patient Name: Andrea Baird Procedure Date: 07/31/2022 10:30 AM MRN: [id number] Account #: [id number] Date of Birth: Nov 30, 1970 Admit Type: Outpatient Age: 52 Room: [facility] ENDO ROOM 1 Gender: Female Note Status: Finalized Instrument Name: Upper Endoscope [id number] Procedure:             Upper GI endoscopy Indications:           Dysphagia Providers:             Jaynie Collins DO, DO Referring MD:          Marisue Ivan (Referring MD) Medicines:             Monitored Anesthesia Care Complications:         No immediate complications. Estimated blood loss:                         Minimal. Procedure:             Pre-Anesthesia Assessment:                        - Prior to the procedure, a History and Physical was                         performed, and patient medications and allergies were                         reviewed. The patient is competent. The risks and                         benefits of the procedure and the sedation options and                         risks were discussed with the patient. All questions                         were answered and informed consent was obtained.                         Patient identification and proposed procedure were                         verified by the physician, the nurse, the anesthetist                         and the technician in the endoscopy suite. Mental                         Status Examination: alert and oriented. Airway                         Examination: normal oropharyngeal airway and neck                         mobility. Respiratory Examination: clear to                         auscultation. CV Examination: RRR, no murmurs, no S3  or S4. Prophylactic Antibiotics: The patient does not                         require prophylactic antibiotics. Prior                         Anticoagulants: The patient has taken no anticoagulant                          or antiplatelet agents. ASA Grade Assessment: II - A                         patient with mild systemic disease. After reviewing                         the risks and benefits, the patient was deemed in                         satisfactory condition to undergo the procedure. The                         anesthesia plan was to use monitored anesthesia care                         (MAC). Immediately prior to administration of                         medications, the patient was re-assessed for adequacy                         to receive sedatives. The heart rate, respiratory                         rate, oxygen saturations, blood pressure, adequacy of                         pulmonary ventilation, and response to care were                         monitored throughout the procedure. The physical                         status of the patient was re-assessed after the                         procedure.                        After obtaining informed consent, the endoscope was                         passed under direct vision. Throughout the procedure,                         the patient's blood pressure, pulse, and oxygen                         saturations were monitored continuously. The Endoscope  was introduced through the mouth, and advanced to the                         second part of duodenum. The upper GI endoscopy was                         accomplished without difficulty. The patient tolerated                         the procedure well. Findings:      The duodenal bulb, first portion of the duodenum and second portion of       the duodenum were normal. Estimated blood loss: none.      The entire examined stomach was normal. Estimated blood loss: none.      The Z-line was regular.      Esophagogastric landmarks were identified: the gastroesophageal junction       was found at 35 cm from the incisors.      Normal mucosa was found in the lower third of the  esophagus.      A widely patent Schatzki ring was found in the lower third of the       esophagus. The scope was withdrawn. Dilation was performed with a       Maloney dilator with no resistance at 46 Fr, 48 Fr and 52 Fr. The       dilation site was examined following endoscope reinsertion and showed       mild mucosal disruption. Estimated blood loss was minimal. Impression:            - Normal duodenal bulb, first portion of the duodenum                         and second portion of the duodenum.                        - Normal stomach.                        - Z-line regular.                        - Esophagogastric landmarks identified.                        - Normal mucosa was found in the lower third of the                         esophagus.                        - Widely patent Schatzki ring. Dilated.                        - No specimens collected. Recommendation:        - Patient has a contact number available for                         emergencies. The signs and symptoms of potential                         delayed complications were  discussed with the patient.                         Return to normal activities tomorrow. Written                         discharge instructions were provided to the patient.                        - Discharge patient to home.                        - Soft diet today.                        - Continue present medications.                        - Repeat upper endoscopy PRN for retreatment.                        - Return to GI clinic as previously scheduled.                        - proceed with colonoscopy                        - The findings and recommendations were discussed with                         the patient. Procedure Code(s):     --- Professional ---                        [id number], Esophagogastroduodenoscopy, flexible,                         transoral; diagnostic, including collection of                         specimen(s) by  brushing or washing, when performed                         (separate procedure)                        43450, Dilation of esophagus, by unguided sound or                         bougie, single or multiple passes Diagnosis Code(s):     --- Professional ---                        K22.2, Esophageal obstruction                        R13.10, Dysphagia, unspecified CPT copyright 2022 American Medical Association. All rights reserved. The codes documented in this report are preliminary and upon coder review may  be revised to meet current compliance requirements. Attending Participation:      I personally performed the entire procedure. Elfredia Nevins, DO Jaynie Collins DO, DO 07/31/2022 11:02:04 AM This report has been signed electronically. Number of Addenda: 0 Note  Initiated On: 07/31/2022 10:30 AM Estimated Blood Loss:  Estimated blood loss was minimal.      [facility]

## 2022-08-01 ENCOUNTER — Encounter: Payer: Self-pay | Admitting: Gastroenterology

## 2022-08-01 ENCOUNTER — Ambulatory Visit: Payer: 59 | Admitting: Podiatry

## 2022-08-01 DIAGNOSIS — M7661 Achilles tendinitis, right leg: Secondary | ICD-10-CM

## 2022-08-01 NOTE — Progress Notes (Signed)
Chief Complaint  Patient presents with   Foot Pain    Patient came in today for bilateral back of the heel pain follow-up, rate of pain 4 out of 10, throbbing pulling ache, patient stopped doing P.T.(made the pain worse, X-Rays done     HPI: 52 y.o. female presenting today for follow-up evaluation of symptomatic Achilles tendinitis of bilateral lower extremities.  Patient continues to have pain and tenderness to the posterior aspect of the heels right greater than the left.  She says that she has been to physical therapy since last visit but there has been no improvement.  She continues to have pain and tenderness.  Her physical therapist actually recommended surgery for her.  She has now been dealing with this pain for over 1 year now.  She is very frustrated.  Presents for further treatment evaluation.  Past Medical History:  Diagnosis Date   Anemia    Chronic headaches 2018   Complication of anesthesia    sometimes wakes up with a bad headache and Nausa    Elevated cholesterol    GERD (gastroesophageal reflux disease)    Hypertension    Otosclerosis, right 2019   PONV (postoperative nausea and vomiting)     Past Surgical History:  Procedure Laterality Date   ABDOMINAL HYSTERECTOMY     COLONOSCOPY, ESOPHAGOGASTRODUODENOSCOPY (EGD) AND ESOPHAGEAL DILATION     ESOPHAGOGASTRODUODENOSCOPY N/A 06/19/2022   Procedure: ESOPHAGOGASTRODUODENOSCOPY (EGD);  Surgeon: Jaynie Collins, DO;  Location: [facility] ENDOSCOPY;  Service: Gastroenterology;  Laterality: N/A;   ESOPHAGOGASTRODUODENOSCOPY (EGD) WITH PROPOFOL N/A 05/05/2017   Procedure: ESOPHAGOGASTRODUODENOSCOPY (EGD) WITH PROPOFOL;  Surgeon: Toledo, Boykin Nearing, MD;  Location: [facility] ENDOSCOPY;  Service: Gastroenterology;  Laterality: N/A;   HYSTERECTOMY ABDOMINAL WITH SALPINGECTOMY Bilateral 11/2015   LAPAROSCOPIC ABDOMINAL EXPLORATION Bilateral 1990   LAPAROTOMY     laproscopy     LYSIS OF ADHESION N/A 06/12/2017   Procedure: LYSIS OF  ADHESION;  Surgeon: Christeen Douglas, MD;  Location: [facility] ORS;  Service: Gynecology;  Laterality: N/A;   TUBAL LIGATION      No Known Allergies   Physical Exam: General: The patient is alert and oriented x3 in no acute distress.  Dermatology: Skin is warm, dry and supple bilateral lower extremities. Negative for open lesions or macerations.  Vascular: Palpable pedal pulses bilaterally. No edema or erythema noted. Capillary refill within normal limits.  Neurological: Epicritic and protective threshold grossly intact bilaterally.   Musculoskeletal Exam: There continues to be chronic pain on palpation noted to the posterior tubercle of the right calcaneus at the insertion of the Achilles tendon consistent with retrocalcaneal bursitis as well as the left lower extremity as well today. Range of motion within normal limits. Muscle strength 5/5 in all muscle groups bilateral lower extremities.  Radiographic Exam B/L feet 01/17/2022:  Posterior and plantar calcaneal spur noted to the respective calcaneus on lateral view. No fracture or dislocation noted. Normal osseous mineralization noted.     Assessment: 1. Insertional Achilles tendinitis bilateral 2.  Posterior heel spur bilateral 3.  Plantar fibroma left; asymptomatic  -Patient evaluated. - Today we discussed surgery in detail for the patient.  Unfortunately she has tried multiple conservative modalities including immobilization in a cam boot, steroid and nonsteroidal anti-inflammatories both oral and injection, shoe gear modifications, physical therapy, with no lasting alleviation of symptoms for her.  She states that she is ready to have surgery -Surgery was explained in detail to the patient including the postoperative recovery course.  Risk benefits advantages and disadvantages were explained in detail in great length to the patient.  She understands that she will be strictly nonweightbearing for minimum 6 weeks to the surgical extremity  followed by progressive rehab.  All patient questions were answered.  No guarantees were expressed or implied -Authorization for surgery was initiated today.  Surgery will consist of retrocalcaneal exostectomy with repair of Achilles tendon right lower extremity -Return to clinic 1 week postop  Felecia Shelling, DPM [facility]  Dr. Felecia Shelling, DPM    [address] [place], [place] [address]                Office [phone]  Fax [phone]

## 2022-08-06 ENCOUNTER — Telehealth: Payer: Self-pay | Admitting: Urology

## 2022-08-06 LAB — SURGICAL PATHOLOGY

## 2022-08-06 NOTE — Telephone Encounter (Signed)
DOS - 08/28/22  ACHILLES TENDON RIGHT --- [id number] CALCANEAL OSTECTOMY RIGHT --- [id number]  [employer]   SPOKE WITH SKY U. WITH [employer] AND SHE STATED THAT FOR CPT CODES 82956 NO PRIOR AUTH IS REQUIRED.  CALL REF # [id number]

## 2022-08-18 ENCOUNTER — Other Ambulatory Visit: Payer: Self-pay | Admitting: Internal Medicine

## 2022-08-18 ENCOUNTER — Ambulatory Visit
Admission: RE | Admit: 2022-08-18 | Discharge: 2022-08-18 | Disposition: A | Discharge status: Home / Self Care | Payer: Self-pay | Source: Ambulatory Visit | Attending: Internal Medicine | Admitting: Internal Medicine

## 2022-08-18 DIAGNOSIS — E7800 Pure hypercholesterolemia, unspecified: Secondary | ICD-10-CM

## 2022-08-28 ENCOUNTER — Other Ambulatory Visit: Payer: Self-pay | Admitting: Podiatry

## 2022-08-28 ENCOUNTER — Encounter: Payer: Self-pay | Admitting: *Deleted

## 2022-08-28 DIAGNOSIS — M7661 Achilles tendinitis, right leg: Secondary | ICD-10-CM

## 2022-08-28 MED ORDER — OXYCODONE-ACETAMINOPHEN 5-325 MG PO TABS: 1.0000 | ORAL_TABLET | ORAL | 0 refills | Status: DC | PRN

## 2022-08-28 MED ORDER — IBUPROFEN 800 MG PO TABS: 800.0000 mg | ORAL_TABLET | Freq: Three times a day (TID) | ORAL | 1 refills | Status: AC

## 2022-08-28 NOTE — Progress Notes (Signed)
PRN postop 

## 2022-09-02 ENCOUNTER — Encounter: Payer: 59 | Admitting: Podiatry

## 2022-09-03 ENCOUNTER — Ambulatory Visit (INDEPENDENT_AMBULATORY_CARE_PROVIDER_SITE_OTHER): Payer: 59

## 2022-09-03 ENCOUNTER — Ambulatory Visit (INDEPENDENT_AMBULATORY_CARE_PROVIDER_SITE_OTHER): Payer: 59 | Admitting: Podiatry

## 2022-09-03 ENCOUNTER — Encounter: Payer: Self-pay | Admitting: Podiatry

## 2022-09-03 VITALS — BP 137/81 | HR 83 | Temp 97.5°F

## 2022-09-03 DIAGNOSIS — N9489 Other specified conditions associated with female genital organs and menstrual cycle: Secondary | ICD-10-CM | POA: Insufficient documentation

## 2022-09-03 DIAGNOSIS — M7661 Achilles tendinitis, right leg: Secondary | ICD-10-CM

## 2022-09-03 DIAGNOSIS — Z9889 Other specified postprocedural states: Secondary | ICD-10-CM

## 2022-09-03 DIAGNOSIS — R102 Pelvic and perineal pain: Secondary | ICD-10-CM | POA: Insufficient documentation

## 2022-09-03 MED ORDER — OXYCODONE-ACETAMINOPHEN 10-325 MG PO TABS: 1.0000 | ORAL_TABLET | Freq: Three times a day (TID) | ORAL | 0 refills | Status: AC | PRN

## 2022-09-03 NOTE — Progress Notes (Signed)
She presents today for her first postop visit date of surgery 08/28/2022.  She had a heel spur resection with repair of Achilles tendon by Dr. Logan Bores.  She states that it has done well on she gets some stinging and burning.  Continues to keep it elevated staying off of it using her knee scooter at all times.  Her husband confirms.  Denies fever chills nausea vomit muscle aches pains calf pain back pain chest pain shortness of breath.  Objective: Vital signs are stable blood pressure is 137/81 pulse is 83 temperature is 97.5 F.  Presents with her husband today in the scooter.  Cast is intact dry and clean.  She has good sensation to her toes she also retains good range of motion of the toes the cast is not tight proximally and she has no proximal calf pain or popliteal pain.  Radiographs taken today demonstrate an osseously mature foot retrocalcaneal heel spur resection is complete no air abscesses or fractures identified.  Assessment: Well-healing surgical foot.  Plan: She will continue nonweightbearing she will continue range of motion of her toes she will keep the cast dry and clean and she will follow-up with Dr. Logan Bores in 1 week

## 2022-09-09 ENCOUNTER — Ambulatory Visit (INDEPENDENT_AMBULATORY_CARE_PROVIDER_SITE_OTHER): Payer: 59 | Admitting: Podiatry

## 2022-09-09 ENCOUNTER — Encounter: Payer: Self-pay | Admitting: Podiatry

## 2022-09-09 DIAGNOSIS — M7661 Achilles tendinitis, right leg: Secondary | ICD-10-CM | POA: Diagnosis not present

## 2022-09-09 DIAGNOSIS — Z9889 Other specified postprocedural states: Secondary | ICD-10-CM

## 2022-09-09 NOTE — Progress Notes (Signed)
   Chief Complaint  Patient presents with   Routine Post Op    POV #2 DOS 08/28/2022 POSTERIOR HEEL SPUR RESECTION RT, REPAIR ACHILLES TENDON RT    Subjective:  Patient presents today status post retrocalcaneal exostectomy with repair of Achilles tendon right.  DOS: 08/28/2022.  Patient doing well.  NWB in boot cast with the knee scooter.  She says that she does have some discomfort to the posterior heel.  No new complaints  Past Medical History:  Diagnosis Date   Anemia    Chronic headaches 2018   Complication of anesthesia    sometimes wakes up with a bad headache and Nausa    Elevated cholesterol    GERD (gastroesophageal reflux disease)    Hypertension    Otosclerosis, right 2019   PONV (postoperative nausea and vomiting)     Past Surgical History:  Procedure Laterality Date   ABDOMINAL HYSTERECTOMY     COLONOSCOPY WITH PROPOFOL N/A 07/31/2022   Procedure: COLONOSCOPY WITH PROPOFOL;  Surgeon: Jaynie Collins, DO;  Location: [facility] ENDOSCOPY;  Service: Gastroenterology;  Laterality: N/A;   COLONOSCOPY, ESOPHAGOGASTRODUODENOSCOPY (EGD) AND ESOPHAGEAL DILATION     ESOPHAGOGASTRODUODENOSCOPY N/A 06/19/2022   Procedure: ESOPHAGOGASTRODUODENOSCOPY (EGD);  Surgeon: Jaynie Collins, DO;  Location: [facility] ENDOSCOPY;  Service: Gastroenterology;  Laterality: N/A;   ESOPHAGOGASTRODUODENOSCOPY N/A 07/31/2022   Procedure: ESOPHAGOGASTRODUODENOSCOPY (EGD);  Surgeon: Jaynie Collins, DO;  Location: [facility] ENDOSCOPY;  Service: Gastroenterology;  Laterality: N/A;   ESOPHAGOGASTRODUODENOSCOPY (EGD) WITH PROPOFOL N/A 05/05/2017   Procedure: ESOPHAGOGASTRODUODENOSCOPY (EGD) WITH PROPOFOL;  Surgeon: Toledo, Boykin Nearing, MD;  Location: [facility] ENDOSCOPY;  Service: Gastroenterology;  Laterality: N/A;   HYSTERECTOMY ABDOMINAL WITH SALPINGECTOMY Bilateral 11/2015   LAPAROSCOPIC ABDOMINAL EXPLORATION Bilateral 1990   LAPAROTOMY     laproscopy     LYSIS OF ADHESION N/A 06/12/2017   Procedure: LYSIS OF  ADHESION;  Surgeon: Christeen Douglas, MD;  Location: [facility] ORS;  Service: Gynecology;  Laterality: N/A;   TUBAL LIGATION      Allergies  Allergen Reactions   Atorvastatin     Other Reaction(s): Muscle Pain  40 mg dosage    Objective/Physical Exam Neurovascular status intact.  Incision well coapted with sutures intact. No sign of infectious process noted. No dehiscence. No active bleeding noted.  Moderate edema noted to the surgical extremity.  Assessment: 1. s/p retrocalcaneal exostectomy with repair of Achilles tendon right. DOS: 08/28/2022   Plan of Care:  -Patient was evaluated.  -The cast was bivalved and removed today -Dressings in the cam boot was applied.  NWB -Return to clinic 2 weeks for suture removal and x-rays.  Felecia Shelling, DPM [facility]  Dr. Felecia Shelling, DPM    [address] [place], [place] [address]                Office [phone]  Fax [phone]

## 2022-09-10 ENCOUNTER — Other Ambulatory Visit: Payer: Self-pay | Admitting: Podiatry

## 2022-09-10 MED ORDER — SILVER SULFADIAZINE 1 % EX CREA: 1.0000 | TOPICAL_CREAM | Freq: Every day | CUTANEOUS | 1 refills | Status: DC

## 2022-09-17 ENCOUNTER — Encounter: Payer: Self-pay | Admitting: Podiatry

## 2022-09-23 ENCOUNTER — Ambulatory Visit (INDEPENDENT_AMBULATORY_CARE_PROVIDER_SITE_OTHER): Payer: 59

## 2022-09-23 ENCOUNTER — Ambulatory Visit (INDEPENDENT_AMBULATORY_CARE_PROVIDER_SITE_OTHER): Payer: 59 | Admitting: Podiatry

## 2022-09-23 ENCOUNTER — Encounter: Payer: Self-pay | Admitting: Podiatry

## 2022-09-23 VITALS — BP 142/76 | HR 96

## 2022-09-23 DIAGNOSIS — M7661 Achilles tendinitis, right leg: Secondary | ICD-10-CM | POA: Diagnosis not present

## 2022-09-23 DIAGNOSIS — Z9889 Other specified postprocedural states: Secondary | ICD-10-CM

## 2022-09-23 NOTE — Progress Notes (Signed)
   Chief Complaint  Patient presents with   Routine Post Op    "It's sore, very sensitive but it's doing better I think."    Subjective:  Patient presents today status post retrocalcaneal exostectomy with repair of Achilles tendon right.  DOS: 08/28/2022.  Continues to do well.  NWB in the cam boot with the knee scooter.  Past Medical History:  Diagnosis Date   Anemia    Chronic headaches 2018   Complication of anesthesia    sometimes wakes up with a bad headache and Nausa    Elevated cholesterol    GERD (gastroesophageal reflux disease)    Hypertension    Otosclerosis, right 2019   PONV (postoperative nausea and vomiting)     Past Surgical History:  Procedure Laterality Date   ABDOMINAL HYSTERECTOMY     COLONOSCOPY WITH PROPOFOL N/A 07/31/2022   Procedure: COLONOSCOPY WITH PROPOFOL;  Surgeon: Jaynie Collins, DO;  Location: [facility] ENDOSCOPY;  Service: Gastroenterology;  Laterality: N/A;   COLONOSCOPY, ESOPHAGOGASTRODUODENOSCOPY (EGD) AND ESOPHAGEAL DILATION     ESOPHAGOGASTRODUODENOSCOPY N/A 06/19/2022   Procedure: ESOPHAGOGASTRODUODENOSCOPY (EGD);  Surgeon: Jaynie Collins, DO;  Location: [facility] ENDOSCOPY;  Service: Gastroenterology;  Laterality: N/A;   ESOPHAGOGASTRODUODENOSCOPY N/A 07/31/2022   Procedure: ESOPHAGOGASTRODUODENOSCOPY (EGD);  Surgeon: Jaynie Collins, DO;  Location: [facility] ENDOSCOPY;  Service: Gastroenterology;  Laterality: N/A;   ESOPHAGOGASTRODUODENOSCOPY (EGD) WITH PROPOFOL N/A 05/05/2017   Procedure: ESOPHAGOGASTRODUODENOSCOPY (EGD) WITH PROPOFOL;  Surgeon: Toledo, Boykin Nearing, MD;  Location: [facility] ENDOSCOPY;  Service: Gastroenterology;  Laterality: N/A;   HYSTERECTOMY ABDOMINAL WITH SALPINGECTOMY Bilateral 11/2015   LAPAROSCOPIC ABDOMINAL EXPLORATION Bilateral 1990   LAPAROTOMY     laproscopy     LYSIS OF ADHESION N/A 06/12/2017   Procedure: LYSIS OF ADHESION;  Surgeon: Christeen Douglas, MD;  Location: [facility] ORS;  Service: Gynecology;  Laterality: N/A;    TUBAL LIGATION      Allergies  Allergen Reactions   Atorvastatin     Other Reaction(s): Muscle Pain  40 mg dosage    Objective/Physical Exam Neurovascular status intact.  Incision well coapted with sutures intact. No sign of infectious process noted. No dehiscence. No active bleeding noted.  Moderate edema noted to the surgical extremity.  Assessment: 1. s/p retrocalcaneal exostectomy with repair of Achilles tendon right. DOS: 08/28/2022   Plan of Care:  -Patient was evaluated.  Sutures removed - Continue NWB for an additional 2 weeks.  Beginning 10/09/2022 patient may begin to slowly apply pressure to the foot in the cam boot.  Slowly increase over the following 2 weeks.  She may begin FWB in the cam boot around 10/23/2022. -I will plan to see her back in 1 month.  At that time she may be full weightbearing in the cam boot.  We will initiate physical therapy at this time  Felecia Shelling, DPM [facility]  Dr. Felecia Shelling, DPM    [address] [place], [place] [address]                Office [phone]  Fax [phone]

## 2022-09-26 IMAGING — MR MR HEAD WO/W CM
14 series · 48 of 48 positions shown · IV contrast (10ml Gadavist)
Comparison: MRI 03/10/2019.

CLINICAL DATA: Headaches.  Brain cyst.

EXAM:
MRI HEAD WITHOUT AND WITH CONTRAST
TECHNIQUE: Multiplanar, multiecho pulse sequences of the brain and surrounding
structures were obtained without and with intravenous contrast.
CONTRAST:  10mL GADAVIST GADOBUTROL 1 MMOL/ML IV SOLN

[Series 5: ax dwi_tracew · axial · 3.0mm · 0.65mm/px · z∈[-61,+94]mm · 2 of 48 slices shown]
[im 1/48]
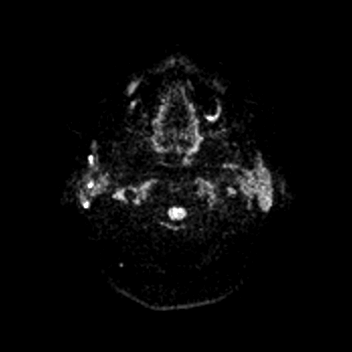
[im 48/48]
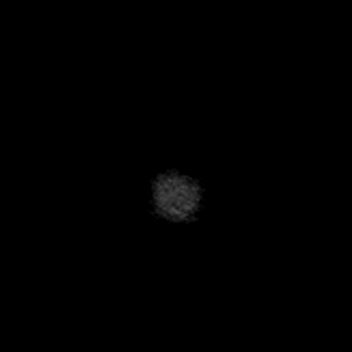

[Series 6: ax dwi_adc · axial · 3.0mm · 0.65mm/px · z∈[-61,+94]mm · 3 of 48 slices shown]
[im 1/48]
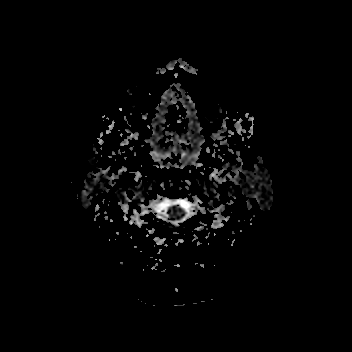
[im 24/48]
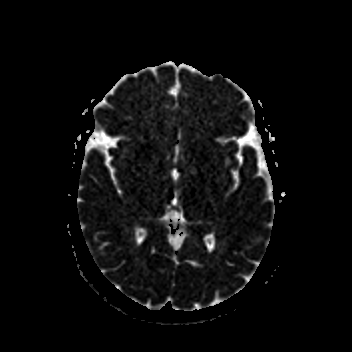
[im 48/48]
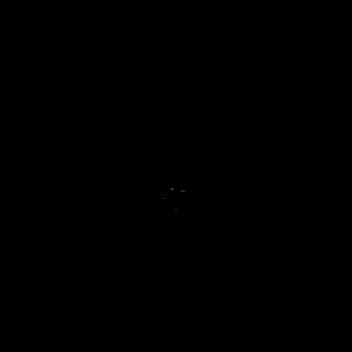

[Series 7: cor dwi_tracew · coronal · 5.0mm · 0.60mm/px · 2 of 38 slices shown]
[im 1/38]
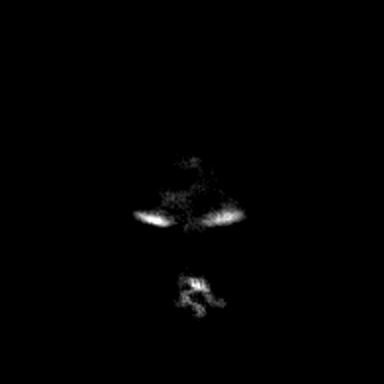
[im 38/38]
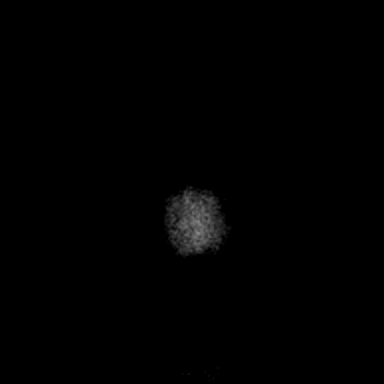

[Series 8: cor dwi_adc · coronal · 5.0mm · 0.60mm/px · 2 of 37 slices shown]
[im 1/37]
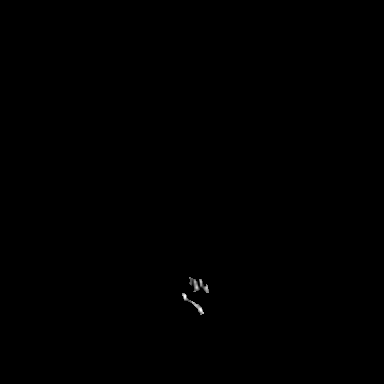
[im 37/37]
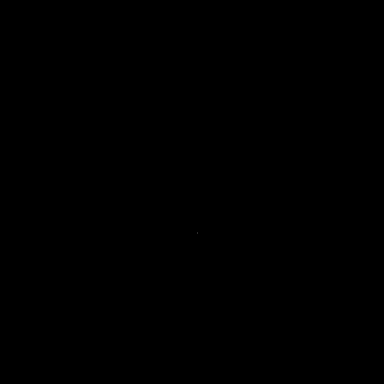

[Series 9: T1 · sagittal · 5.0mm · 0.62mm/px · 1 of 23 slices shown (1 of 2)]
[im 1/23]
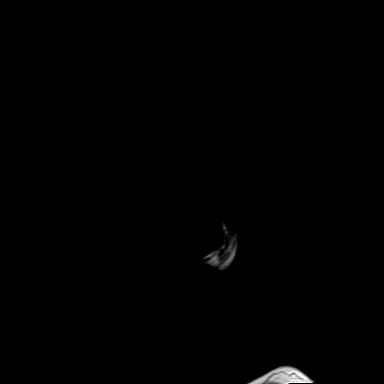

[Series 10: T2 · axial · 5.0mm · 0.53mm/px · z∈[-58,+98]mm · 2 of 27 slices shown]
[im 1/27]
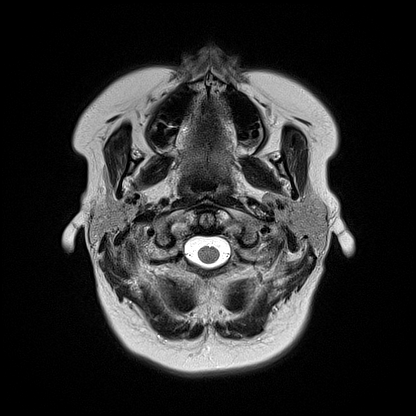
[im 27/27]
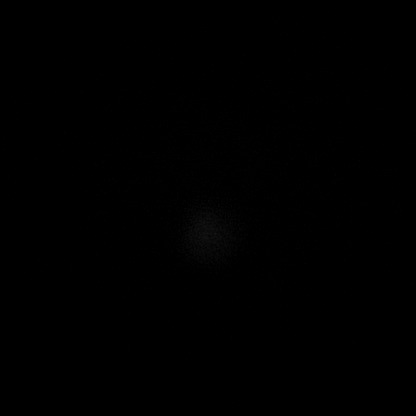

[Series 11: mag_images · axial · 3.0mm · 0.90mm/px · z∈[-69,+108]mm · 3 of 60 slices shown]
[im 1/60]
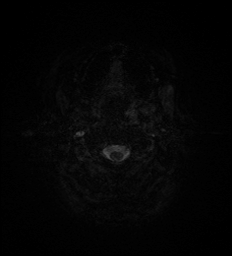
[im 30/60]
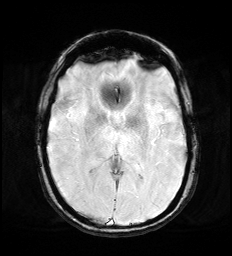
[im 60/60]
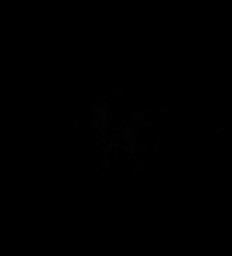

[Series 12: pha_images · axial · 3.0mm · 0.90mm/px · z∈[-69,+108]mm · 3 of 60 slices shown]
[im 1/60]
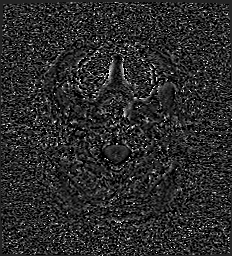
[im 30/60]
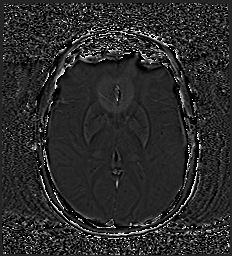
[im 60/60]
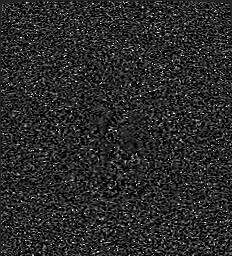

[Series 13: swi_images · axial · 3.0mm · 0.90mm/px · z∈[-69,+108]mm · 3 of 60 slices shown]
[im 1/60]
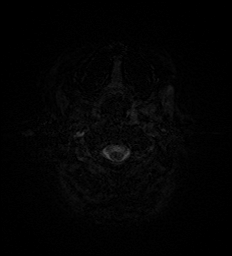
[im 30/60]
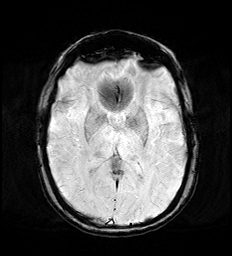
[im 60/60]
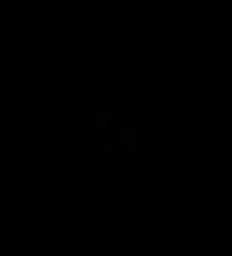

[Series 15: FLAIR · axial · 3.0mm · 0.53mm/px · z∈[-61,+101]mm · 3 of 55 slices shown]
[im 1/55]
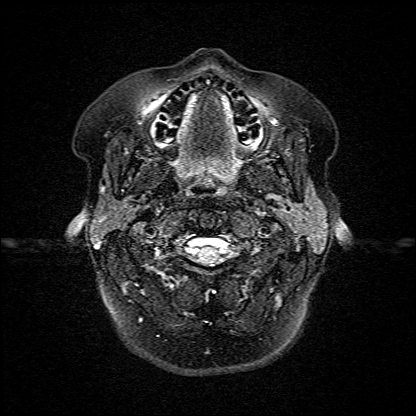
[im 28/55]
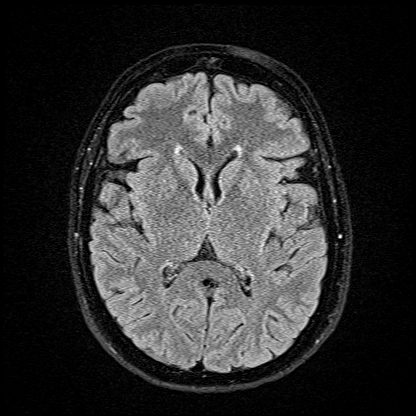
[im 55/55]
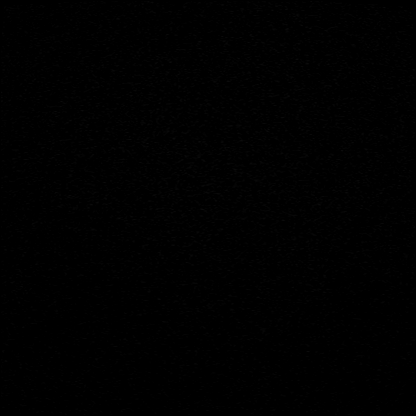

[Series 16: T1 · axial · 1.0mm · 0.98mm/px · z∈[-71,+103]mm · 10 of 175 slices shown (2 of 2)]
[im 1/175]
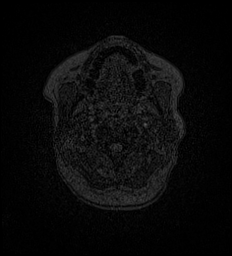
[im 20/175]
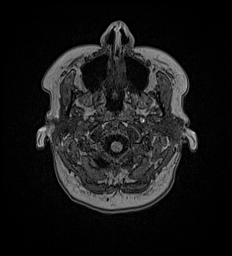
[im 39/175]
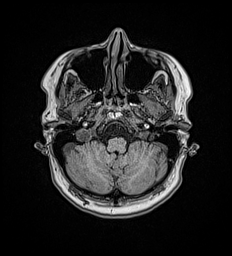
[im 59/175]
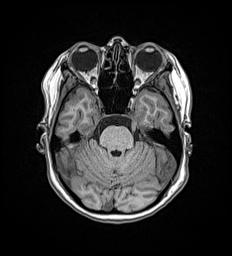
[im 78/175]
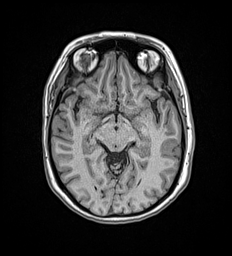
[im 97/175]
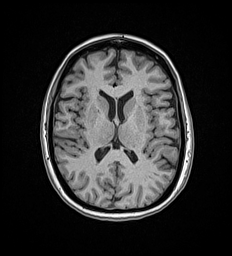
[im 117/175]
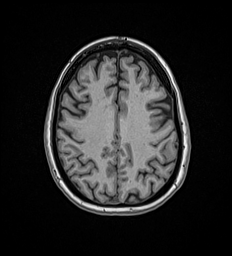
[im 136/175]
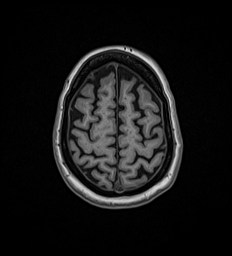
[im 155/175]
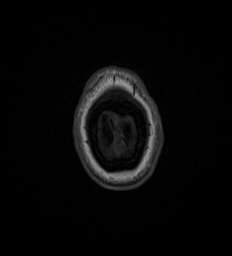
[im 175/175]
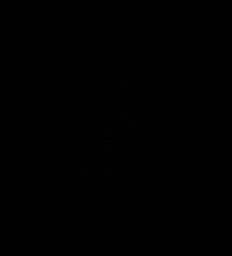

[Series 17: T2 post-contrast · coronal · 5.0mm · 0.57mm/px · 2 of 29 slices shown]
[im 1/29]
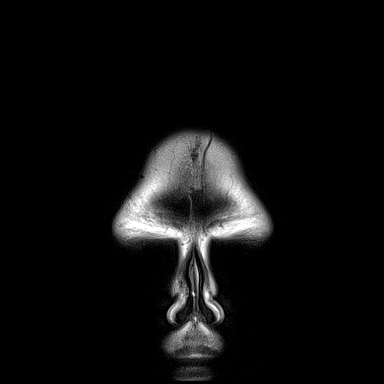
[im 29/29]
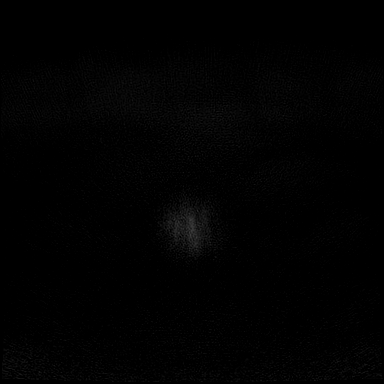

[Series 18: T1 post-contrast · axial · 1.0mm · 0.98mm/px · z∈[-71,+104]mm · 10 of 175 slices shown (1 of 2)]
[im 1/175]
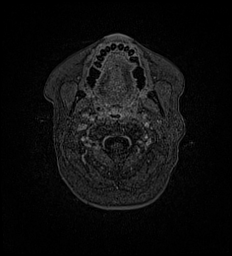
[im 20/175]
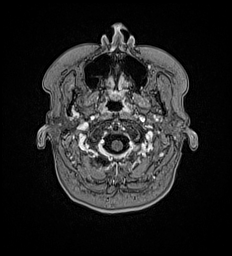
[im 39/175]
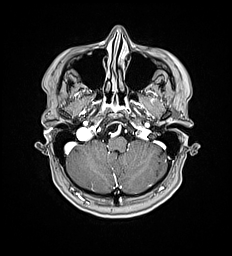
[im 59/175]
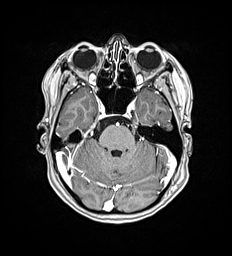
[im 78/175]
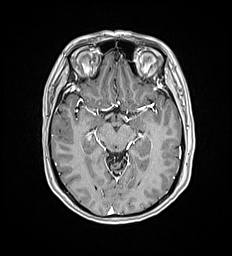
[im 97/175]
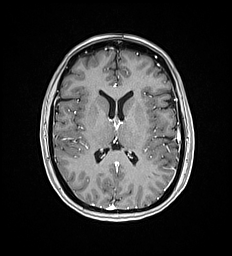
[im 117/175]
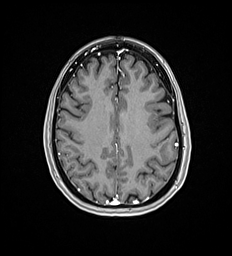
[im 136/175]
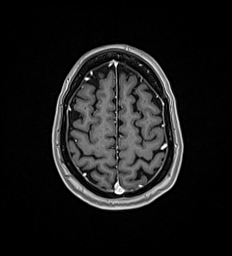
[im 155/175]
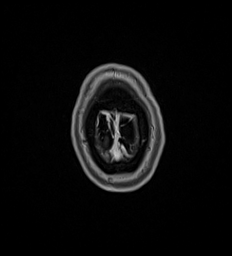
[im 175/175]
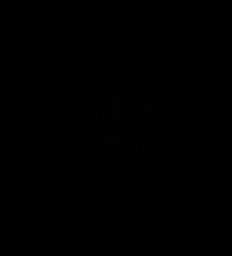

[Series 19: T1 post-contrast · coronal · 5.0mm · 0.57mm/px · 2 of 29 slices shown (2 of 2)]
[im 1/29]
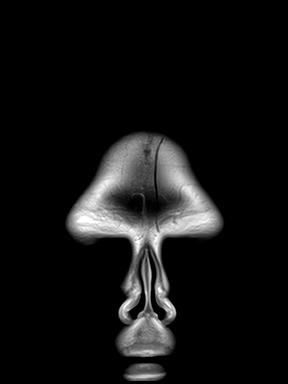
[im 29/29]
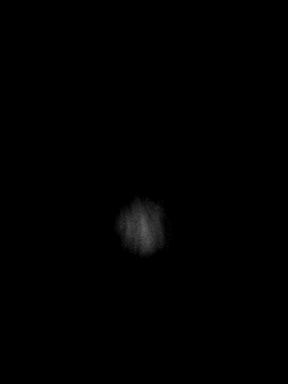

[48 of 48 positions shown; findings below may reference images not displayed]

FINDINGS: Brain: No change in the simple cyst within the anterior left
temporal lobe with minimal surrounding edema/gliosis. The cyst is
nonenhancing and measures approximately 10 x 10 mm. This cyst
suppresses on FLAIR and is closely associated with a proximal left
M2 MCA branch. No substantial mass effect. No abnormal enhancement.
No acute infarct. No hydrocephalus. No extra-axial fluid collection.
No midline shift

Vascular: Major arterial flow voids are maintained at the skull
base.

Skull and upper cervical spine: Normal marrow signal.

Sinuses/Orbits: Sinuses are largely clear.  Unremarkable orbits.

Other: No mastoid effusions.
IMPRESSION: 1. No acute abnormality.
2. Stable 10 mm nonenhancing cystic lesion within the anterior
temporal lobe. This is again favored benign, most likely an anterior
temporal lobe dilated perivascular space given characteristic
location closely associated with a proximal left M2 MCA branch.
Neural glial cyst is a differential consideration.

## 2022-10-09 ENCOUNTER — Telehealth: Payer: Self-pay

## 2022-10-09 NOTE — Telephone Encounter (Signed)
Patient called - She has begun WB as tolerated - she is wondering if she still needs to use an ace wrap or not - Spoke to patient and advised that it is up to her - to use it if she needs the compression or stability, but that it is not necessary at this point. Patient voiced understanding

## 2022-10-28 ENCOUNTER — Encounter: Payer: Self-pay | Admitting: Podiatry

## 2022-10-28 ENCOUNTER — Ambulatory Visit (INDEPENDENT_AMBULATORY_CARE_PROVIDER_SITE_OTHER): Payer: 59 | Admitting: Podiatry

## 2022-10-28 DIAGNOSIS — Z9889 Other specified postprocedural states: Secondary | ICD-10-CM

## 2022-10-28 NOTE — Progress Notes (Signed)
   Chief Complaint  Patient presents with   Routine Post Op    DOS: 08/28/2022 retrocalcaneal exostectomy with repair of Achilles tendon right.  "I'm doing better.  I'm using a walker now."    Subjective:  Patient presents today status post retrocalcaneal exostectomy with repair of Achilles tendon right.  DOS: 08/28/2022.  Continues to do well.  Patient has begun weightbearing in the cam boot.  She is doing well.  Past Medical History:  Diagnosis Date   Anemia    Chronic headaches 2018   Complication of anesthesia    sometimes wakes up with a bad headache and Nausa    Elevated cholesterol    GERD (gastroesophageal reflux disease)    Hypertension    Otosclerosis, right 2019   PONV (postoperative nausea and vomiting)     Past Surgical History:  Procedure Laterality Date   ABDOMINAL HYSTERECTOMY     COLONOSCOPY WITH PROPOFOL N/A 07/31/2022   Procedure: COLONOSCOPY WITH PROPOFOL;  Surgeon: Jaynie Collins, DO;  Location: [facility] ENDOSCOPY;  Service: Gastroenterology;  Laterality: N/A;   COLONOSCOPY, ESOPHAGOGASTRODUODENOSCOPY (EGD) AND ESOPHAGEAL DILATION     ESOPHAGOGASTRODUODENOSCOPY N/A 06/19/2022   Procedure: ESOPHAGOGASTRODUODENOSCOPY (EGD);  Surgeon: Jaynie Collins, DO;  Location: [facility] ENDOSCOPY;  Service: Gastroenterology;  Laterality: N/A;   ESOPHAGOGASTRODUODENOSCOPY N/A 07/31/2022   Procedure: ESOPHAGOGASTRODUODENOSCOPY (EGD);  Surgeon: Jaynie Collins, DO;  Location: [facility] ENDOSCOPY;  Service: Gastroenterology;  Laterality: N/A;   ESOPHAGOGASTRODUODENOSCOPY (EGD) WITH PROPOFOL N/A 05/05/2017   Procedure: ESOPHAGOGASTRODUODENOSCOPY (EGD) WITH PROPOFOL;  Surgeon: Toledo, Boykin Nearing, MD;  Location: [facility] ENDOSCOPY;  Service: Gastroenterology;  Laterality: N/A;   HYSTERECTOMY ABDOMINAL WITH SALPINGECTOMY Bilateral 11/2015   LAPAROSCOPIC ABDOMINAL EXPLORATION Bilateral 1990   LAPAROTOMY     laproscopy     LYSIS OF ADHESION N/A 06/12/2017   Procedure: LYSIS OF ADHESION;   Surgeon: Christeen Douglas, MD;  Location: [facility] ORS;  Service: Gynecology;  Laterality: N/A;   TUBAL LIGATION      Allergies  Allergen Reactions   Atorvastatin     Other Reaction(s): Muscle Pain  40 mg dosage    Objective/Physical Exam Neurovascular status intact.  Incision nicely healed.  Muscle strength 5/5 all compartments.  No edema.  Overall well-healing surgical foot   assessment: 1. s/p retrocalcaneal exostectomy with repair of Achilles tendon right. DOS: 08/28/2022   Plan of Care:  -Patient was evaluated.  -Continue weightbearing in the cam boot -Prescription provided for physical therapy at [facility] -Patient may begin to transition out of the cam boot into good supportive tennis shoes and sneakers beginning 11/18/2022 -Return to clinic 2 months  Felecia Shelling, DPM [facility]  Dr. Felecia Shelling, DPM    [address] [place], [place] [address]                Office [phone]  Fax [phone]

## 2022-12-05 NOTE — Progress Notes (Signed)
Outpatient surgery on 08/28/2022 at [facility]  Achilles Tendon Repair right foot Calcaneal Ostectomy (Posterior) right foot

## 2022-12-30 ENCOUNTER — Ambulatory Visit (INDEPENDENT_AMBULATORY_CARE_PROVIDER_SITE_OTHER): Payer: 59 | Admitting: Podiatry

## 2022-12-30 ENCOUNTER — Encounter: Payer: Self-pay | Admitting: Podiatry

## 2022-12-30 DIAGNOSIS — M7661 Achilles tendinitis, right leg: Secondary | ICD-10-CM

## 2022-12-30 MED ORDER — GABAPENTIN 300 MG PO CAPS: 300.0000 mg | ORAL_CAPSULE | Freq: Every day | ORAL | 0 refills | Status: AC

## 2022-12-30 NOTE — Progress Notes (Signed)
Chief Complaint  Patient presents with   Routine Post Op    DOS: 08/28/2022 retrocalcaneal exostectomy with repair of Achilles tendon right   "I am doing a lot better today"    Subjective:  Patient presents today status post retrocalcaneal exostectomy with repair of Achilles tendon right.  DOS: 08/28/2022.  Continues to do well.  Patient now wearing good supportive tennis shoes and sneakers.  She continues to go to physical therapy weekly  Past Medical History:  Diagnosis Date   Anemia    Chronic headaches 2018   Complication of anesthesia    sometimes wakes up with a bad headache and Nausa    Elevated cholesterol    GERD (gastroesophageal reflux disease)    Hypertension    Otosclerosis, right 2019   PONV (postoperative nausea and vomiting)     Past Surgical History:  Procedure Laterality Date   ABDOMINAL HYSTERECTOMY     COLONOSCOPY WITH PROPOFOL N/A 07/31/2022   Procedure: COLONOSCOPY WITH PROPOFOL;  Surgeon: Jaynie Collins, DO;  Location: [facility] ENDOSCOPY;  Service: Gastroenterology;  Laterality: N/A;   COLONOSCOPY, ESOPHAGOGASTRODUODENOSCOPY (EGD) AND ESOPHAGEAL DILATION     ESOPHAGOGASTRODUODENOSCOPY N/A 06/19/2022   Procedure: ESOPHAGOGASTRODUODENOSCOPY (EGD);  Surgeon: Jaynie Collins, DO;  Location: [facility] ENDOSCOPY;  Service: Gastroenterology;  Laterality: N/A;   ESOPHAGOGASTRODUODENOSCOPY N/A 07/31/2022   Procedure: ESOPHAGOGASTRODUODENOSCOPY (EGD);  Surgeon: Jaynie Collins, DO;  Location: [facility] ENDOSCOPY;  Service: Gastroenterology;  Laterality: N/A;   ESOPHAGOGASTRODUODENOSCOPY (EGD) WITH PROPOFOL N/A 05/05/2017   Procedure: ESOPHAGOGASTRODUODENOSCOPY (EGD) WITH PROPOFOL;  Surgeon: Toledo, Boykin Nearing, MD;  Location: [facility] ENDOSCOPY;  Service: Gastroenterology;  Laterality: N/A;   HYSTERECTOMY ABDOMINAL WITH SALPINGECTOMY Bilateral 11/2015   LAPAROSCOPIC ABDOMINAL EXPLORATION Bilateral 1990   LAPAROTOMY     laproscopy     LYSIS OF ADHESION N/A 06/12/2017    Procedure: LYSIS OF ADHESION;  Surgeon: Christeen Douglas, MD;  Location: [facility] ORS;  Service: Gynecology;  Laterality: N/A;   TUBAL LIGATION      No Known Allergies   Objective/Physical Exam Neurovascular status intact.  Incision nicely healed.  Muscle strength 5/5 all compartments.  No edema.  Overall well-healing surgical foot.  Minimal tenderness with palpation to the posterior tubercle of the right calcaneus surgical site  Patient does have a long chronic history of posterior heel spur with insertional Achilles tendinitis to the left lower extremity as well.  There is pain with palpation to the posterior tubercle of the left calcaneus.  Radiographic exam LT foot 01/17/2022 will Posterior and plantar calcaneal spur noted to the respective calcaneus on lateral view. No fracture or dislocation noted. Normal osseous mineralization noted.     assessment: 1. s/p retrocalcaneal exostectomy with repair of Achilles tendon right. DOS: 08/28/2022 2.  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  - Continue to wear good supportive tennis shoes and sneakers -Continue physical therapy at [facility] weekly as needed -From a surgical standpoint patient may resume full activity with no restrictions -Patient states that she does have some burning sensation especially at night when she goes to bed to the right posterior heel.  Prescription for gabapentin 300 mg nightly -Patient would like to pursue surgery to her left lower extremity in the beginning of next year.  Unfortunately she has failed conservative treatment to the left lower extremity and she is very satisfied with the results of her right lower extremity surgery. -Return to clinic PRN for surgical consult  elderly  Felecia Shelling, DPM [facility]  Dr. Felecia Shelling, DPM    [address] [place], [place] [address]                 Office [phone]  Fax [phone]

## 2023-03-18 ENCOUNTER — Other Ambulatory Visit: Payer: Self-pay | Admitting: Podiatry

## 2023-04-10 ENCOUNTER — Ambulatory Visit: Payer: 59 | Admitting: Podiatry

## 2023-04-10 ENCOUNTER — Encounter: Payer: Self-pay | Admitting: Podiatry

## 2023-04-10 ENCOUNTER — Ambulatory Visit (INDEPENDENT_AMBULATORY_CARE_PROVIDER_SITE_OTHER): Payer: 59

## 2023-04-10 DIAGNOSIS — M7661 Achilles tendinitis, right leg: Secondary | ICD-10-CM | POA: Diagnosis not present

## 2023-04-10 DIAGNOSIS — M898X9 Other specified disorders of bone, unspecified site: Secondary | ICD-10-CM | POA: Diagnosis not present

## 2023-04-10 NOTE — Progress Notes (Signed)
Chief Complaint  Patient presents with   Foot Pain    "I have a lump on the back of my heel." N - lump  L - posterior heel right D - 1 month O - suddenly, about the same C - sore  A - touch T - Voltaren Cream at night    Subjective:  Patient presents today status post retrocalcaneal exostectomy with repair of Achilles tendon right.  DOS: 08/28/2022.  Patient is doing very well however over the last few weeks she noticed a small bump to the posterior aspect of the right heel.  Minimally tender.  Past Medical History:  Diagnosis Date   Anemia    Chronic headaches 2018   Complication of anesthesia    sometimes wakes up with a bad headache and Nausa    Elevated cholesterol    GERD (gastroesophageal reflux disease)    Hypertension    Otosclerosis, right 2019   PONV (postoperative nausea and vomiting)     Past Surgical History:  Procedure Laterality Date   ABDOMINAL HYSTERECTOMY     COLONOSCOPY WITH PROPOFOL N/A 07/31/2022   Procedure: COLONOSCOPY WITH PROPOFOL;  Surgeon: Jaynie Collins, DO;  Location: [facility] ENDOSCOPY;  Service: Gastroenterology;  Laterality: N/A;   COLONOSCOPY, ESOPHAGOGASTRODUODENOSCOPY (EGD) AND ESOPHAGEAL DILATION     ESOPHAGOGASTRODUODENOSCOPY N/A 06/19/2022   Procedure: ESOPHAGOGASTRODUODENOSCOPY (EGD);  Surgeon: Jaynie Collins, DO;  Location: [facility] ENDOSCOPY;  Service: Gastroenterology;  Laterality: N/A;   ESOPHAGOGASTRODUODENOSCOPY N/A 07/31/2022   Procedure: ESOPHAGOGASTRODUODENOSCOPY (EGD);  Surgeon: Jaynie Collins, DO;  Location: [facility] ENDOSCOPY;  Service: Gastroenterology;  Laterality: N/A;   ESOPHAGOGASTRODUODENOSCOPY (EGD) WITH PROPOFOL N/A 05/05/2017   Procedure: ESOPHAGOGASTRODUODENOSCOPY (EGD) WITH PROPOFOL;  Surgeon: Toledo, Boykin Nearing, MD;  Location: [facility] ENDOSCOPY;  Service: Gastroenterology;  Laterality: N/A;   HYSTERECTOMY ABDOMINAL WITH SALPINGECTOMY Bilateral 11/2015   LAPAROSCOPIC ABDOMINAL EXPLORATION Bilateral 1990    LAPAROTOMY     laproscopy     LYSIS OF ADHESION N/A 06/12/2017   Procedure: LYSIS OF ADHESION;  Surgeon: Christeen Douglas, MD;  Location: [facility] ORS;  Service: Gynecology;  Laterality: N/A;   TUBAL LIGATION      No Known Allergies   Objective/Physical Exam Neurovascular status intact.  Incision nicely healed.  Small palpable lump noted which is likely nonabsorbable suture.  Muscle strength 5/5 all compartments.  No edema or erythema to the heel.  Patient does have a long chronic history of posterior heel spur with insertional Achilles tendinitis to the left lower extremity as well.  There is pain with palpation to the posterior tubercle of the left calcaneus.  Radiographic exam LT foot 04/10/2023 Posterior and plantar calcaneal spur noted to the respective calcaneus on lateral view. No fracture or dislocation noted. Normal osseous mineralization noted.     assessment: 1. s/p retrocalcaneal exostectomy with repair of Achilles tendon right. DOS: 08/28/2022 2.  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  X-rays reviewed -Explained to the patient that this is likely the knot from the nonabsorbable suture anchor utilized intraoperatively.  No concern unless it becomes very symptomatic then we can take it out if needed -In the meantime continue to wear good supportive tennis shoes and sneakers - Return to clinic as needed for surgical consult left lower extremity  Felecia Shelling, DPM [facility]  Dr. Felecia Shelling, DPM    [address]  [place], [place] [address]                Office [phone]  Fax [phone]

## 2023-11-25 ENCOUNTER — Other Ambulatory Visit: Payer: Self-pay | Admitting: Family Medicine

## 2023-11-25 DIAGNOSIS — N6320 Unspecified lump in the left breast, unspecified quadrant: Secondary | ICD-10-CM

## 2023-12-01 ENCOUNTER — Ambulatory Visit
Admission: RE | Admit: 2023-12-01 | Discharge: 2023-12-01 | Disposition: A | Discharge status: Home / Self Care | Source: Ambulatory Visit | Attending: Family Medicine | Admitting: Family Medicine

## 2023-12-01 DIAGNOSIS — N6320 Unspecified lump in the left breast, unspecified quadrant: Secondary | ICD-10-CM | POA: Diagnosis present

## 2023-12-15 ENCOUNTER — Ambulatory Visit

## 2023-12-15 DIAGNOSIS — L820 Inflamed seborrheic keratosis: Secondary | ICD-10-CM | POA: Diagnosis not present

## 2023-12-15 DIAGNOSIS — W908XXA Exposure to other nonionizing radiation, initial encounter: Secondary | ICD-10-CM

## 2023-12-15 DIAGNOSIS — L578 Other skin changes due to chronic exposure to nonionizing radiation: Secondary | ICD-10-CM

## 2023-12-15 DIAGNOSIS — L814 Other melanin hyperpigmentation: Secondary | ICD-10-CM | POA: Diagnosis not present

## 2023-12-15 DIAGNOSIS — D229 Melanocytic nevi, unspecified: Secondary | ICD-10-CM

## 2023-12-15 DIAGNOSIS — D485 Neoplasm of uncertain behavior of skin: Secondary | ICD-10-CM

## 2023-12-15 DIAGNOSIS — D239 Other benign neoplasm of skin, unspecified: Secondary | ICD-10-CM

## 2023-12-15 DIAGNOSIS — B079 Viral wart, unspecified: Secondary | ICD-10-CM

## 2023-12-15 DIAGNOSIS — D225 Melanocytic nevi of trunk: Secondary | ICD-10-CM

## 2023-12-15 DIAGNOSIS — Z1283 Encounter for screening for malignant neoplasm of skin: Secondary | ICD-10-CM

## 2023-12-15 DIAGNOSIS — L821 Other seborrheic keratosis: Secondary | ICD-10-CM

## 2023-12-15 DIAGNOSIS — Z86018 Personal history of other benign neoplasm: Secondary | ICD-10-CM

## 2023-12-15 DIAGNOSIS — D1801 Hemangioma of skin and subcutaneous tissue: Secondary | ICD-10-CM

## 2023-12-15 HISTORY — DX: Other benign neoplasm of skin, unspecified: D23.9

## 2023-12-15 NOTE — Patient Instructions (Addendum)

## 2023-12-15 NOTE — Progress Notes (Signed)
 New Patient Visit   Subjective  Andrea Baird is a 53 y.o. female who presents for the following: Skin Cancer Screening and Full Body Skin Exam  The patient presents for Total-Body Skin Exam (TBSE) for skin cancer screening and mole check. The patient has spots, moles and lesions to be evaluated, some may be new or changing and the patient may have concern these could be cancer.  Lesion on the R face that has been there a few years and is growing in size.  Lesion on the finger that was a flat scar, but recently started to swell and drained pus, now lesion is flat again.  The following portions of the chart were reviewed this encounter and updated as appropriate: medications, allergies, medical history  Review of Systems:  No other skin or systemic complaints except as noted in HPI or Assessment and Plan.  Objective  Well appearing patient in no apparent distress; mood and affect are within normal limits.  A full examination was performed including scalp, head, eyes, ears, nose, lips, neck, chest, axillae, abdomen, back, buttocks, bilateral upper extremities, bilateral lower extremities, hands, feet, fingers, toes, fingernails, and toenails. All findings within normal limits unless otherwise noted below.   - Angioma(s): Scattered red vascular papule(s) on the trunk - Lentigo/lentigines: Scattered pigmented macules that are tan to brown in color and are somewhat non-uniform in shape and concentrated in the sun-exposed areas of the trunk and upper extremities - Nevus/nevi: Scattered well-demarcated, regular, pigmented macule(s) and/or papule(s) on the scattered diffusely - Seborrheic Keratosis(es): Stuck-on appearing keratotic papule(s) on the trunk, some  irritated with redness, crusting, edema, and/or partial avulsion - Actinic Damage/Elastosis: chronic sun damage: dyspigmentation, telangiectasia, and wrinkling   Relevant physical exam findings are noted in the Assessment and  Plan.  R cheek x 1 Erythematous stuck-on, waxy papule or plaque R 3rd finger x 1 Verrucous papules -- Discussed viral etiology and contagion.  Central mid back 0.3 cm irregular brown macule.   Assessment & Plan   SKIN CANCER SCREENING PERFORMED TODAY.  ACTINIC DAMAGE - Chronic condition, secondary to cumulative UV/sun exposure - diffuse scaly erythematous macules with underlying dyspigmentation - Recommend daily broad spectrum sunscreen SPF 30+ to sun-exposed areas, reapply every 2 hours as needed.  - Staying in the shade or wearing long sleeves, sun glasses (UVA+UVB protection) and wide brim hats (4-inch brim around the entire circumference of the hat) are also recommended for sun protection.  - Call for new or changing lesions.  LENTIGINES, SEBORRHEIC KERATOSES, HEMANGIOMAS - Benign normal skin lesions - Benign-appearing - Call for any changes  MELANOCYTIC NEVI - Tan-brown and/or pink-flesh-colored symmetric macules and papules - Benign appearing on exam today - Observation - Call clinic for new or changing moles - Recommend daily use of broad spectrum spf 30+ sunscreen to sun-exposed areas.   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  Call if any new or changing lesions are noted between office visits  INFLAMED SEBORRHEIC KERATOSIS R cheek x 1 Symptomatic, irritating, patient would like treated.  Destruction of lesion - R cheek x 1 Complexity: simple   Destruction method: cryotherapy   Informed consent: discussed and consent obtained   Timeout:  patient name, date of birth, surgical site, and procedure verified Lesion destroyed using liquid nitrogen: Yes   Region  frozen until ice ball extended beyond lesion: Yes   Outcome: patient tolerated procedure well with no complications    Post-procedure details: wound care instructions given    VIRAL WARTS, UNSPECIFIED TYPE R 3rd finger x 1 Viral Wart (HPV) Counseling  Discussed viral / HPV (Human Papilloma Virus) etiology and risk of spread /infectivity to other areas of body as well as to other people.  Multiple treatments and methods may be required to clear warts and it is possible treatment may not be successful.  Treatment risks include discoloration; scarring and there is still potential for wart recurrence.  Recommend Wartstick OTC QHS under occlusion with bandage or duct tape. Destruction of lesion - R 3rd finger x 1 Complexity: simple   Destruction method: cryotherapy   Informed consent: discussed and consent obtained   Timeout:  patient name, date of birth, surgical site, and procedure verified Lesion destroyed using liquid nitrogen: Yes   Region frozen until ice ball extended beyond lesion: Yes   Cryo cycles: 1 or 2. Outcome: patient tolerated procedure well with no complications   Post-procedure details: wound care instructions given    NEOPLASM OF UNCERTAIN BEHAVIOR OF SKIN Central mid back Epidermal / dermal shaving  Lesion diameter (cm):  0.3 Informed consent: discussed and consent obtained   Timeout: patient name, date of birth, surgical site, and procedure verified   Procedure prep:  Patient was prepped and draped in usual sterile fashion Prep type:  Isopropyl alcohol Anesthesia: the lesion was anesthetized in a standard fashion   Anesthetic:  1% lidocaine  w/ epinephrine  1-100,000 buffered w/ 8.4% NaHCO3 Instrument used: DermaBlade   Hemostasis achieved with: pressure and aluminum chloride   Outcome: patient tolerated procedure well   Post-procedure details: wound care instructions given    Specimen 1 - Surgical pathology Differential Diagnosis: D48.5 r/o dysplastic nevus Check Margins: Yes  Return in about 1 year (around 12/14/2024) for TBSE.  LILLETTE Rosina Mayans, CMA, am acting as scribe for  Lauraine JAYSON Kanaris, MD .   Documentation: I have reviewed the above documentation for accuracy and completeness, and I agree with the above.  Lauraine JAYSON Kanaris, MD

## 2023-12-18 LAB — SURGICAL PATHOLOGY

## 2023-12-21 ENCOUNTER — Ambulatory Visit: Payer: Self-pay

## 2023-12-21 ENCOUNTER — Telehealth: Payer: Self-pay

## 2023-12-21 NOTE — Progress Notes (Signed)
 LMTRC

## 2023-12-21 NOTE — Telephone Encounter (Signed)
 Discussed biopsy results with patient

## 2023-12-22 NOTE — Telephone Encounter (Signed)
 Patient informed of pathology results

## 2023-12-22 NOTE — Telephone Encounter (Signed)
-----   Message from Lauraine JAYSON Kanaris sent at 12/21/2023  8:57 AM EDT -----     1. Skin, central mid back :       DYSPLASTIC JUNCTIONAL LENTIGINOUS NEVUS WITH MODERATE ATYPIA, LIMITED MARGINS       FREE    Please notify patient with below plan: Excisino margins free, no further tx  ----- Message ----- From: Interface, Lab In Three Zero One Sent: 12/18/2023   6:17 PM EDT To: Lauraine JAYSON Kanaris, MD

## 2024-03-16 NOTE — H&P (Signed)
 "  Pre-Procedure H&P   Patient ID: Andrea Baird is a 54 y.o. female.  Gastroenterology Provider: [name], DO  Referring Provider: Dr. Alla PCP: Alla Amis, MD  Date: 03/17/2024  HPI Andrea Baird is a 54 y.o. female who presents today for Esophagogastroduodenoscopy for gerd, dysphagia, stenosis .  Egd in 07/2022- dilated with 52 fr maloney with disruption  Increasing dysphagia to solids over last 3 months with patient compensating to avoid issues. No odynophagia or issues with pills/liquids.   Past Medical History:  Diagnosis Date   Anemia    Chronic headaches 2018   Complication of anesthesia    sometimes wakes up with a bad headache and Nausa    Dysplastic nevus 12/15/2023   central mid back, mod atypia   Elevated cholesterol    GERD (gastroesophageal reflux disease)    Hypertension    Otosclerosis, right 2019   PONV (postoperative nausea and vomiting)     Past Surgical History:  Procedure Laterality Date   ABDOMINAL HYSTERECTOMY     CHOLECYSTECTOMY     COLONOSCOPY WITH PROPOFOL  N/A 07/31/2022   Procedure: COLONOSCOPY WITH PROPOFOL ;  Surgeon: Jungling Elspeth Ozell, DO;  Location: [facility] ENDOSCOPY;  Service: Gastroenterology;  Laterality: N/A;   COLONOSCOPY, ESOPHAGOGASTRODUODENOSCOPY (EGD) AND ESOPHAGEAL DILATION     ESOPHAGOGASTRODUODENOSCOPY N/A 06/19/2022   Procedure: ESOPHAGOGASTRODUODENOSCOPY (EGD);  Surgeon: Jungling Elspeth Ozell, DO;  Location: [facility] ENDOSCOPY;  Service: Gastroenterology;  Laterality: N/A;   ESOPHAGOGASTRODUODENOSCOPY N/A 07/31/2022   Procedure: ESOPHAGOGASTRODUODENOSCOPY (EGD);  Surgeon: Jungling Elspeth Ozell, DO;  Location: [facility] ENDOSCOPY;  Service: Gastroenterology;  Laterality: N/A;   ESOPHAGOGASTRODUODENOSCOPY (EGD) WITH PROPOFOL  N/A 05/05/2017   Procedure: ESOPHAGOGASTRODUODENOSCOPY (EGD) WITH PROPOFOL ;  Surgeon: Toledo, Ladell POUR, MD;  Location: [facility] ENDOSCOPY;  Service: Gastroenterology;  Laterality: N/A;    HYSTERECTOMY ABDOMINAL WITH SALPINGECTOMY Bilateral 11/2015   LAPAROSCOPIC ABDOMINAL EXPLORATION Bilateral 1990   LAPAROTOMY     laproscopy     LYSIS OF ADHESION N/A 06/12/2017   Procedure: LYSIS OF ADHESION;  Surgeon: Verdon Keen, MD;  Location: [facility] ORS;  Service: Gynecology;  Laterality: N/A;   TUBAL LIGATION      Family History Mother and father crc No h/o GI disease or malignancy  Review of Systems  Constitutional:  Negative for activity change, appetite change, chills, diaphoresis, fatigue, fever and unexpected weight change.  HENT:  Positive for trouble swallowing. Negative for voice change.   Respiratory:  Negative for shortness of breath and wheezing.   Cardiovascular:  Negative for chest pain, palpitations and leg swelling.  Gastrointestinal:  Negative for abdominal distention, abdominal pain, anal bleeding, blood in stool, constipation, diarrhea, nausea, rectal pain and vomiting.  Musculoskeletal:  Negative for arthralgias and myalgias.  Skin:  Negative for color change and pallor.  Neurological:  Negative for dizziness, syncope and weakness.  Psychiatric/Behavioral:  Negative for confusion.   All other systems reviewed and are negative.    Medications Medications Ordered Prior to Encounter[1]  Pertinent medications related to GI and procedure were reviewed by me with the patient prior to the procedure  Current Medications[2]  sodium chloride  20 mL/hr at 03/17/24 0837       Allergies[3] Allergies were reviewed by me prior to the procedure  Objective   Body mass index is 38.02 kg/m. Vitals:   03/17/24 0830  BP: 116/76  Pulse: 83  Resp: 20  Temp: (!) 96.2 F (35.7 C)  SpO2: 98%  Weight: 120.2 kg  Height: 5' 10 (1.778 m)  Physical Exam Vitals and nursing note reviewed.  Constitutional:      General: She is not in acute distress.    Appearance: Normal appearance. She is not ill-appearing, toxic-appearing or diaphoretic.  HENT:     Head:  Normocephalic and atraumatic.     Nose: Nose normal.     Mouth/Throat:     Mouth: Mucous membranes are moist.     Pharynx: Oropharynx is clear.  Eyes:     General: No scleral icterus.    Extraocular Movements: Extraocular movements intact.  Cardiovascular:     Rate and Rhythm: Normal rate and regular rhythm.     Heart sounds: Normal heart sounds. No murmur heard.    No friction rub. No gallop.  Pulmonary:     Effort: Pulmonary effort is normal. No respiratory distress.     Breath sounds: Normal breath sounds. No wheezing, rhonchi or rales.  Abdominal:     General: Bowel sounds are normal. There is no distension.     Palpations: Abdomen is soft.     Tenderness: There is no abdominal tenderness. There is no guarding or rebound.  Musculoskeletal:     Cervical back: Neck supple.     Right lower leg: No edema.     Left lower leg: No edema.  Skin:    General: Skin is warm and dry.     Coloration: Skin is not jaundiced or pale.  Neurological:     General: No focal deficit present.     Mental Status: She is alert and oriented to person, place, and time. Mental status is at baseline.  Psychiatric:        Mood and Affect: Mood normal.        Behavior: Behavior normal.        Thought Content: Thought content normal.        Judgment: Judgment normal.      Assessment:  Andrea Baird is a 54 y.o. female  who presents today for Esophagogastroduodenoscopy for gerd, dysphagia, stenosis .  Plan:  Esophagogastroduodenoscopy with possible intervention today  Esophagogastroduodenoscopy with possible biopsy, control of bleeding, polypectomy, and interventions as necessary has been discussed with the patient/patient representative. Informed consent was obtained from the patient/patient representative after explaining the indication, nature, and risks of the procedure including but not limited to death, bleeding, perforation, missed neoplasm/lesions, cardiorespiratory compromise, and  reaction to medications. Opportunity for questions was given and appropriate answers were provided. Patient/patient representative has verbalized understanding is amenable to undergoing the procedure.   [name], DO  [facility]  Portions of the record may have been created with voice recognition software. Occasional wrong-word or 'sound-a-like' substitutions may have occurred due to the inherent limitations of voice recognition software.  Read the chart carefully and recognize, using context, where substitutions may have occurred.     [1]  No current facility-administered medications on file prior to encounter.   Current Outpatient Medications on File Prior to Encounter  Medication Sig Dispense Refill   amLODipine (NORVASC) 10 MG tablet Take 10 mg by mouth daily.     chlorthalidone (HYGROTON) 25 MG tablet Take 12.5 mg by mouth daily.     omeprazole (PRILOSEC) 40 MG capsule Take 40 mg by mouth in the morning and at bedtime.     rosuvastatin (CRESTOR) 5 MG tablet Take 5 mg by mouth daily.     aspirin EC 81 MG tablet Take 81 mg by mouth daily. Swallow whole.  colestipol (COLESTID) 1 g tablet Take by mouth.     gabapentin  (NEURONTIN ) 300 MG capsule Take 1 capsule (300 mg total) by mouth at bedtime. (Patient not taking: Reported on 03/17/2024) 60 capsule 0   ibuprofen  (ADVIL ) 800 MG tablet Take 1 tablet (800 mg total) by mouth 3 (three) times daily. 60 tablet 1   metoprolol  tartrate (LOPRESSOR ) 100 MG tablet Take 1 tablet (100 mg total) by mouth once for 1 dose. Please take one time dose 100mg  metoprolol  tartrate 2 hr prior to cardiac CT for HR control IF HR >55bpm. 1 tablet 0   Multiple Vitamin (MULTIVITAMIN) tablet Take 1 tablet by mouth daily. (Patient not taking: Reported on 03/17/2024)     pantoprazole (PROTONIX) 40 MG tablet Take 40 mg by mouth 2 (two) times daily. (Patient not taking: Reported on 03/17/2024)     silver  sulfADIAZINE  (SILVADENE ) 1 % cream  APPLY 1 APPLICATION TOPICALLY DAILY 50 g 1  [2]  Current Facility-Administered Medications:    0.9 %  sodium chloride  infusion, , Intravenous, Continuous, Onita Elspeth Sharper, DO, Last Rate: 20 mL/hr at 03/17/24 0837, New Bag at 03/17/24 0837 [3] No Known Allergies  "

## 2024-03-17 ENCOUNTER — Encounter
Admission: RE | Disposition: A | Discharge status: Home / Self Care | Payer: Self-pay | Source: Home / Self Care | Attending: Gastroenterology

## 2024-03-17 ENCOUNTER — Other Ambulatory Visit: Payer: Self-pay

## 2024-03-17 ENCOUNTER — Ambulatory Visit: Admitting: General Practice

## 2024-03-17 ENCOUNTER — Encounter: Payer: Self-pay | Admitting: Gastroenterology

## 2024-03-17 ENCOUNTER — Ambulatory Visit
Admission: RE | Admit: 2024-03-17 | Discharge: 2024-03-17 | Disposition: A | Discharge status: Home / Self Care | Attending: Gastroenterology | Admitting: Gastroenterology

## 2024-03-17 DIAGNOSIS — K224 Dyskinesia of esophagus: Secondary | ICD-10-CM | POA: Insufficient documentation

## 2024-03-17 DIAGNOSIS — K219 Gastro-esophageal reflux disease without esophagitis: Secondary | ICD-10-CM | POA: Diagnosis not present

## 2024-03-17 DIAGNOSIS — K449 Diaphragmatic hernia without obstruction or gangrene: Secondary | ICD-10-CM | POA: Insufficient documentation

## 2024-03-17 DIAGNOSIS — I10 Essential (primary) hypertension: Secondary | ICD-10-CM | POA: Diagnosis not present

## 2024-03-17 DIAGNOSIS — K222 Esophageal obstruction: Secondary | ICD-10-CM | POA: Diagnosis not present

## 2024-03-17 DIAGNOSIS — R1310 Dysphagia, unspecified: Secondary | ICD-10-CM | POA: Insufficient documentation

## 2024-03-17 HISTORY — PX: ESOPHAGOGASTRODUODENOSCOPY: SHX5428

## 2024-03-17 SURGERY — EGD (ESOPHAGOGASTRODUODENOSCOPY)
Anesthesia: General

## 2024-03-17 MED ORDER — PROPOFOL 10 MG/ML IV BOLUS
INTRAVENOUS | Status: DC | PRN
  Administered 2024-03-17: 200 mg via INTRAVENOUS
  Administered 2024-03-17 (×2): 50 mg via INTRAVENOUS

## 2024-03-17 MED ORDER — LIDOCAINE HCL (PF) 2 % IJ SOLN
INTRAMUSCULAR | Status: AC
  Filled 2024-03-17: qty 5

## 2024-03-17 MED ORDER — SODIUM CHLORIDE 0.9 % IV SOLN: INTRAVENOUS | Status: DC

## 2024-03-17 MED ORDER — GLYCOPYRROLATE 0.2 MG/ML IJ SOLN
INTRAMUSCULAR | Status: AC
  Filled 2024-03-17: qty 1

## 2024-03-17 MED ORDER — PROPOFOL 10 MG/ML IV BOLUS
INTRAVENOUS | Status: AC
  Filled 2024-03-17: qty 20

## 2024-03-17 MED ORDER — LIDOCAINE HCL (CARDIAC) PF 100 MG/5ML IV SOSY
PREFILLED_SYRINGE | INTRAVENOUS | Status: DC | PRN
  Administered 2024-03-17: 100 mg via INTRAVENOUS

## 2024-03-17 MED ORDER — GLYCOPYRROLATE 0.2 MG/ML IJ SOLN
INTRAMUSCULAR | Status: DC | PRN
  Administered 2024-03-17: .2 mg via INTRAVENOUS

## 2024-03-17 MED ORDER — DEXMEDETOMIDINE HCL IN NACL 80 MCG/20ML IV SOLN
INTRAVENOUS | Status: DC | PRN
  Administered 2024-03-17: 12 ug via INTRAVENOUS

## 2024-03-17 MED ORDER — PROPOFOL 500 MG/50ML IV EMUL
INTRAVENOUS | Status: DC | PRN
  Administered 2024-03-17: 125 ug/kg/min via INTRAVENOUS

## 2024-03-17 NOTE — Interval H&P Note (Signed)
 History and Physical Interval Note: Preprocedure H&P from 03/17/2024  was reviewed and there was no interval change after seeing and examining the patient.  Written consent was obtained from the patient after discussion of risks, benefits, and alternatives. Patient has consented to proceed with Esophagogastroduodenoscopy with possible intervention   03/17/2024 9:39 AM  Andrea Baird  has presented today for surgery, with the diagnosis of GERD without esophagitis (K21.9) Esophageal dysphagia (R13.19) Esophageal stenosis (K22.2).  The various methods of treatment have been discussed with the patient and family. After consideration of risks, benefits and other options for treatment, the patient has consented to  Procedures: EGD (ESOPHAGOGASTRODUODENOSCOPY) (N/A) as a surgical intervention.  The patient's history has been reviewed, patient examined, no change in status, stable for surgery.  I have reviewed the patient's chart and labs.  Questions were answered to the patient's satisfaction.     [name]

## 2024-03-17 NOTE — Transfer of Care (Signed)
 Immediate Anesthesia Transfer of Care Note  Patient: Andrea Baird  Procedure(s) Performed: EGD (ESOPHAGOGASTRODUODENOSCOPY) Balloon dilation wire-guided  Patient Location: PACU  Anesthesia Type:General  Level of Consciousness: awake, alert , oriented, and patient cooperative  Airway & Oxygen Therapy: Patient Spontanous Breathing and Patient connected to nasal cannula oxygen  Post-op Assessment: Report given to RN, Post -op Vital signs reviewed and stable, and Patient moving all extremities X 4  Post vital signs: Reviewed and stable  Last Vitals:  Vitals Value Taken Time  BP 102/81 03/17/2024 1001  Temp 36 03/17/2024 1000  Pulse 87 03/17/2024 1000  Resp 16 03/17/2024 1000  SpO2 98 03/17/2024 1000    Last Pain:  Vitals:   03/17/24 0830  PainSc: 0-No pain         Complications: No notable events documented.

## 2024-03-17 NOTE — Anesthesia Preprocedure Evaluation (Signed)
 "                                  Anesthesia Evaluation  Patient identified by MRN, date of birth, ID band Patient awake    Reviewed: Allergy & Precautions, NPO status , Patient's Chart, lab work & pertinent test results  Airway Mallampati: III  TM Distance: >3 FB Neck ROM: Full    Dental  (+) Teeth Intact   Pulmonary neg pulmonary ROS   Pulmonary exam normal breath sounds clear to auscultation       Cardiovascular Exercise Tolerance: Good hypertension, Pt. on medications negative cardio ROS Normal cardiovascular exam Rhythm:Regular Rate:Normal     Neuro/Psych  Headaches negative neurological ROS  negative psych ROS   GI/Hepatic negative GI ROS, Neg liver ROS,GERD  Medicated,,  Endo/Other  negative endocrine ROS    Renal/GU      Musculoskeletal   Abdominal   Peds  Hematology negative hematology ROS (+)   Anesthesia Other Findings Past Medical History: No date: Anemia 2018: Chronic headaches No date: Complication of anesthesia     Comment:  sometimes wakes up with a bad headache and Nausa  No date: Elevated cholesterol No date: GERD (gastroesophageal reflux disease) No date: Hypertension 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, right No date: PONV (postoperative nausea and vomiting)  Past Surgical History: No date: ABDOMINAL HYSTERECTOMY No date: COLONOSCOPY, ESOPHAGOGASTRODUODENOSCOPY (EGD) AND ESOPHAGEAL  DILATION 06/19/2022: ESOPHAGOGASTRODUODENOSCOPY; N/A     Comment:  Procedure: ESOPHAGOGASTRODUODENOSCOPY (EGD);  Surgeon:               Onita Elspeth Sharper, DO;  Location: [facility] ENDOSCOPY;                Service: Gastroenterology;  Laterality: N/A; 05/05/2017: ESOPHAGOGASTRODUODENOSCOPY (EGD) WITH PROPOFOL ; N/A     Comment:  Procedure: ESOPHAGOGASTRODUODENOSCOPY (EGD) WITH               PROPOFOL ;  Surgeon: Toledo, Ladell POUR, MD;  Location:               [facility] ENDOSCOPY;  Service: Gastroenterology;  Laterality:               N/A; 11/2015: HYSTERECTOMY  ABDOMINAL WITH SALPINGECTOMY; Bilateral 1990: LAPAROSCOPIC ABDOMINAL EXPLORATION; Bilateral No date: LAPAROTOMY No date: laproscopy 06/12/2017: LYSIS OF ADHESION; N/A     Comment:  Procedure: LYSIS OF ADHESION;  Surgeon: Verdon Keen, MD;  Location: [facility] ORS;  Service: Gynecology;                Laterality: N/A; No date: TUBAL LIGATION  BMI    Body Mass Index: 36.65 kg/m      Reproductive/Obstetrics negative OB ROS                              Anesthesia Physical Anesthesia Plan  ASA: 2  Anesthesia Plan: General   Post-op Pain Management: Minimal or no pain anticipated   Induction: Intravenous  PONV Risk Score and Plan: 2 and Propofol  infusion and TIVA  Airway Management Planned: Nasal Cannula  Additional Equipment: None  Intra-op Plan:   Post-operative Plan:   Informed Consent: I have reviewed the patients History and Physical, chart, labs and discussed the procedure including the risks, benefits and alternatives for the proposed anesthesia with the patient or authorized representative who  has indicated his/her understanding and acceptance.     Dental advisory given  Plan Discussed with: CRNA and Surgeon  Anesthesia Plan Comments: (Discussed risks of anesthesia with patient, including possibility of difficulty with spontaneous ventilation under anesthesia necessitating airway intervention, PONV, and rare risks such as cardiac or respiratory or neurological events, and allergic reactions. Discussed the role of CRNA in patient's perioperative care. Patient understands.)        Anesthesia Quick Evaluation  "

## 2024-03-17 NOTE — Anesthesia Postprocedure Evaluation (Signed)
"   Anesthesia Post Note  Patient: Andrea Baird  Procedure(s) Performed: EGD (ESOPHAGOGASTRODUODENOSCOPY) Balloon dilation wire-guided  Patient location during evaluation: Endoscopy Anesthesia Type: General Level of consciousness: awake and alert Pain management: pain level controlled Vital Signs Assessment: post-procedure vital signs reviewed and stable Respiratory status: spontaneous breathing, nonlabored ventilation, respiratory function stable and patient connected to nasal cannula oxygen Cardiovascular status: blood pressure returned to baseline and stable Postop Assessment: no apparent nausea or vomiting Anesthetic complications: no   No notable events documented.   Last Vitals:  Vitals:   03/17/24 0830 03/17/24 1000  BP: 116/76   Pulse: 83   Resp: 20   Temp: (!) 35.7 C (!) 35.8 C  SpO2: 98%     Last Pain:  Vitals:   03/17/24 0830  PainSc: 0-No pain                 Debby Mines      "

## 2024-03-17 NOTE — Op Note (Signed)
 [facility] Patient Name: Andrea Baird Procedure Date: 03/17/2024 9:18 AM MRN: [id number] Account #: [id number] Date of Birth: [birth year] Admit Type: Outpatient Age: 54 Room: [facility] ENDO ROOM 1 Gender: Female Note Status: Finalized Instrument Name: Endoscope [id number] Procedure:             Upper GI endoscopy Indications:           Dysphagia, gerd, esophageal stenosis Providers:             Elspeth Ozell Onita ROSALEA, DO Referring MD:          Alda Carpen (Referring MD) Medicines:             Monitored Anesthesia Care Complications:         No immediate complications. Estimated blood loss:                         Minimal. Procedure:             Pre-Anesthesia Assessment:                        - Prior to the procedure, a History and Physical was                         performed, and patient medications and allergies were                         reviewed. The patient is competent. The risks and                         benefits of the procedure and the sedation options and                         risks were discussed with the patient. All questions                         were answered and informed consent was obtained.                         Patient identification and proposed procedure were                         verified by the physician, the nurse, the anesthetist                         and the technician in the endoscopy suite. Mental                         Status Examination: alert and oriented. Airway                         Examination: normal oropharyngeal airway and neck                         mobility. Respiratory Examination: clear to                         auscultation. CV Examination: RRR, no murmurs, no S3  or S4. Prophylactic Antibiotics: The patient does not                         require prophylactic antibiotics. Prior                         Anticoagulants: The patient has taken no anticoagulant                          or antiplatelet agents. ASA Grade Assessment: II - A                         patient with mild systemic disease. After reviewing                         the risks and benefits, the patient was deemed in                         satisfactory condition to undergo the procedure. The                         anesthesia plan was to use monitored anesthesia care                         (MAC). Immediately prior to administration of                         medications, the patient was re-assessed for adequacy                         to receive sedatives. The heart rate, respiratory                         rate, oxygen saturations, blood pressure, adequacy of                         pulmonary ventilation, and response to care were                         monitored throughout the procedure. The physical                         status of the patient was re-assessed after the                         procedure.                        After obtaining informed consent, the endoscope was                         passed under direct vision. Throughout the procedure,                         the patient's blood pressure, pulse, and oxygen                         saturations were monitored continuously. The Endoscope  was introduced through the mouth, and advanced to the                         third part of duodenum. The upper GI endoscopy was                         accomplished without difficulty. The patient tolerated                         the procedure well. Findings:      The duodenal bulb, first portion of the duodenum, second portion of the       duodenum and third portion of the duodenum were normal. Estimated blood       loss: none.      The entire examined stomach was normal. Estimated blood loss: none.      A small hiatal hernia was present. Estimated blood loss: none.      The Z-line was regular. Estimated blood loss: none.      Esophagogastric landmarks  were identified: the gastroesophageal junction       was found at 35 cm from the incisors.      One benign-appearing, intrinsic moderate (circumferential scarring or       stenosis; an endoscope may pass) stenosis was found. This stenosis       measured 1.6 cm (inner diameter) x less than one cm (in length). The       stenosis was traversed. A TTS dilator was passed through the scope.       Dilation with a 15-16.5-18 mm balloon dilator was performed to 15 mm,       16.5 mm and 18 mm. The dilation site was examined following endoscope       reinsertion and showed no change. 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 No specimens collected. Estimated blood loss       was minimal.      Abnormal motility was noted in the esophagus. The cricopharyngeus was       normal. There is spasticity of the esophageal body. The distal       esophagus/lower esophageal sphincter is open. Estimated blood loss: none.      The exam of the esophagus was otherwise normal. Impression:            - Normal duodenal bulb, first portion of the duodenum,                         second portion of the duodenum and third portion of                         the duodenum.                        - Normal stomach.                        - Small hiatal hernia.                        - Z-line regular.                        - Esophagogastric landmarks identified.                        -  Benign-appearing esophageal stenosis. Dilated.                         Biopsied.                        - Abnormal esophageal motility, consistent with                         esophageal spasm. Recommendation:        - Patient has a contact number available for                         emergencies. The signs and symptoms of potential                         delayed complications were discussed with the patient.                         Return to normal activities tomorrow. Written                         discharge  instructions were provided to the patient.                        - Discharge patient to home.                        - Resume previous diet.                        - Continue present medications.                        - Repeat upper endoscopy PRN for retreatment.                        - Return to GI office PRN.                        - The findings and recommendations were discussed with                         the patient. Procedure Code(s):     --- Professional ---                        [id number], Esophagogastroduodenoscopy, flexible,                         transoral; with transendoscopic balloon dilation of                         esophagus (less than 30 mm diameter)                        43239, 59, Esophagogastroduodenoscopy, flexible,                         transoral; with biopsy, single or multiple Diagnosis Code(s):     --- Professional ---  K44.9, Diaphragmatic hernia without obstruction or                         gangrene                        K22.2, Esophageal obstruction                        K22.4, Dyskinesia of esophagus                        R13.10, Dysphagia, unspecified CPT copyright 2022 [employer]. All rights reserved. The codes documented in this report are preliminary and upon coder review may  be revised to meet current compliance requirements. Attending Participation:      I personally performed the entire procedure. Elspeth Jungling, DO [name] DO, DO 03/17/2024 10:12:00 AM This report has been signed electronically. Number of Addenda: 0 Note Initiated On: 03/17/2024 9:18 AM Estimated Blood Loss:  Estimated blood loss was minimal.      [facility]

## 2024-12-14 ENCOUNTER — Ambulatory Visit
